# Patient Record
Sex: Female | Born: 1965 | Hispanic: Yes | Marital: Single | Smoking: Never smoker
Health system: Southern US, Community
[De-identification: ages and names within clinical notes are randomized; demographics above are authoritative.]

## PROBLEM LIST (undated history)

## (undated) DIAGNOSIS — Z5189 Encounter for other specified aftercare: Secondary | ICD-10-CM

## (undated) DIAGNOSIS — D649 Anemia, unspecified: Secondary | ICD-10-CM

## (undated) HISTORY — PX: CHOLECYSTECTOMY: SHX55

## (undated) HISTORY — DX: Morbid (severe) obesity due to excess calories: E66.01

## (undated) HISTORY — PX: APPENDECTOMY: SHX54

## (undated) HISTORY — DX: Anemia, unspecified: D64.9

---

## 1993-08-29 HISTORY — PX: TUBAL LIGATION: SHX77

## 2001-10-26 ENCOUNTER — Encounter: Payer: Self-pay | Admitting: Surgery

## 2001-10-26 ENCOUNTER — Observation Stay (HOSPITAL_COMMUNITY): Admission: EM | Admit: 2001-10-26 | Discharge: 2001-10-28 | Payer: Self-pay | Admitting: Emergency Medicine

## 2001-10-26 ENCOUNTER — Encounter: Payer: Self-pay | Admitting: Emergency Medicine

## 2002-01-29 ENCOUNTER — Emergency Department (HOSPITAL_COMMUNITY): Admission: EM | Admit: 2002-01-29 | Discharge: 2002-01-30 | Payer: Self-pay | Admitting: Emergency Medicine

## 2002-01-30 ENCOUNTER — Encounter: Payer: Self-pay | Admitting: Emergency Medicine

## 2002-02-28 ENCOUNTER — Emergency Department (HOSPITAL_COMMUNITY): Admission: EM | Admit: 2002-02-28 | Discharge: 2002-02-28 | Payer: Self-pay

## 2002-12-22 ENCOUNTER — Emergency Department (HOSPITAL_COMMUNITY): Admission: EM | Admit: 2002-12-22 | Discharge: 2002-12-22 | Payer: Self-pay | Admitting: Emergency Medicine

## 2003-06-07 ENCOUNTER — Emergency Department (HOSPITAL_COMMUNITY): Admission: EM | Admit: 2003-06-07 | Discharge: 2003-06-07 | Payer: Self-pay | Admitting: Emergency Medicine

## 2003-11-22 ENCOUNTER — Emergency Department (HOSPITAL_COMMUNITY): Admission: EM | Admit: 2003-11-22 | Discharge: 2003-11-22 | Payer: Self-pay | Admitting: Emergency Medicine

## 2004-08-26 ENCOUNTER — Emergency Department (HOSPITAL_COMMUNITY): Admission: EM | Admit: 2004-08-26 | Discharge: 2004-08-26 | Payer: Self-pay | Admitting: Emergency Medicine

## 2004-12-05 ENCOUNTER — Emergency Department (HOSPITAL_COMMUNITY): Admission: EM | Admit: 2004-12-05 | Discharge: 2004-12-05 | Payer: Self-pay | Admitting: Emergency Medicine

## 2005-04-04 ENCOUNTER — Emergency Department (HOSPITAL_COMMUNITY): Admission: EM | Admit: 2005-04-04 | Discharge: 2005-04-04 | Payer: Self-pay | Admitting: Emergency Medicine

## 2005-06-07 ENCOUNTER — Inpatient Hospital Stay (HOSPITAL_COMMUNITY): Admission: EM | Admit: 2005-06-07 | Discharge: 2005-06-09 | Payer: Self-pay | Admitting: Emergency Medicine

## 2005-06-07 ENCOUNTER — Encounter (INDEPENDENT_AMBULATORY_CARE_PROVIDER_SITE_OTHER): Payer: Self-pay | Admitting: *Deleted

## 2008-02-14 ENCOUNTER — Emergency Department (HOSPITAL_COMMUNITY): Admission: EM | Admit: 2008-02-14 | Discharge: 2008-02-14 | Payer: Self-pay | Admitting: Emergency Medicine

## 2009-03-15 ENCOUNTER — Emergency Department (HOSPITAL_COMMUNITY): Admission: EM | Admit: 2009-03-15 | Discharge: 2009-03-15 | Payer: Self-pay | Admitting: Emergency Medicine

## 2009-10-28 ENCOUNTER — Emergency Department (HOSPITAL_COMMUNITY): Admission: EM | Admit: 2009-10-28 | Discharge: 2009-10-29 | Payer: Self-pay | Admitting: Emergency Medicine

## 2010-09-18 ENCOUNTER — Encounter: Payer: Self-pay | Admitting: Gastroenterology

## 2010-11-21 LAB — URINALYSIS, ROUTINE W REFLEX MICROSCOPIC
Bilirubin Urine: NEGATIVE
Glucose, UA: NEGATIVE mg/dL
Ketones, ur: NEGATIVE mg/dL
Nitrite: NEGATIVE
Protein, ur: NEGATIVE mg/dL
Specific Gravity, Urine: 1.008 (ref 1.005–1.030)
Urobilinogen, UA: 0.2 mg/dL (ref 0.0–1.0)
pH: 6 (ref 5.0–8.0)

## 2010-11-21 LAB — URINE CULTURE: Colony Count: >=100000

## 2010-11-21 LAB — URINE MICROSCOPIC-ADD ON

## 2010-12-05 LAB — POCT URINALYSIS DIP (DEVICE)
Bilirubin Urine: NEGATIVE
Glucose, UA: NEGATIVE mg/dL
Ketones, ur: NEGATIVE mg/dL
Nitrite: NEGATIVE
Protein, ur: NEGATIVE mg/dL
Specific Gravity, Urine: <=1.005 (ref 1.005–1.030)
Urobilinogen, UA: 0.2 mg/dL (ref 0.0–1.0)
pH: 6 (ref 5.0–8.0)

## 2010-12-05 LAB — POCT PREGNANCY, URINE: Preg Test, Ur: NEGATIVE

## 2011-01-14 NOTE — Discharge Summary (Signed)
NAMESEHER, SCHLAGEL             ACCOUNT NO.:  [id number]   MEDICAL RECORD NO.:  [id number]          PATIENT TYPE:  INP   LOCATION:  5703                         FACILITY:  [facility]   PHYSICIAN:  Lebron Conners, M.D.   DATE OF BIRTH:  [birth year]   DATE OF ADMISSION:  06/07/2005  DATE OF DISCHARGE:  06/09/2005                                 DISCHARGE SUMMARY   DISCHARGE DIAGNOSES:  1.  Cholelithiasis, acute cholecystitis and choledocholithiasis, status post      laparoscopic cholecystectomy with intraoperative cholangiogram on      June 07, 2005 by Dr. Orson Slick.  2.  History of cesarean section.   HOSPITAL COURSE:  Ms. Vinzant is a 45 year old female who presented to the  emergency room with a 1 day history of epigastric right upper quadrant pain.  She was diagnosed with acute cholecystitis and underwent a laparoscopic  cholecystectomy with intraoperative cholangiogram on June 07, 2005. Dr.  Orson Slick performed the procedure and he was concerned that she had a retained  common bile duct stone as demonstrated on the cholangiogram. The following  day, the patient underwent an ERCP and sphincterotomy with stone removal by  Dr. Virginia Rochester. The patient remained in the hospital overnight and was felt to be  ready for discharge to home.   LABORATORY DATA:  Her lab studies during her hospital stay did show a mild  elevation of her liver function studies but by discharge, her AST was 144,  ALT 307, alkaline phosphatase 158, and total bilirubin 0.8.   DISPOSITION:  She is discharged to home in stable condition.   DISCHARGE MEDICATIONS:  1.  She was instructed to use Tylenol or Ibuprofen as needed.  2.  She may use Vicodin as prescribed.   FOLLOW UP:  She is to followup with Dr. Orson Slick on June 28, 2005 at 9:45  a.m.      Guy Franco, P.A.      Lebron Conners, M.D.  Electronically Signed    LB/MEDQ  D:  07/29/2005  T:  07/30/2005  Job:  [id number]   cc:   Georgiana Spinner, M.D.  Fax:  [phone]   Lebron Conners, M.D.  [address]  [place]  [place] [address]

## 2011-01-14 NOTE — Op Note (Signed)
[name], [name]             ACCOUNT NO.:  [id number]   MEDICAL RECORD NO.:  [id number]          PATIENT TYPE:  INP   LOCATION:  5703                         FACILITY:  [facility]   PHYSICIAN:  Georgiana Spinner, M.D.    DATE OF BIRTH:  [birth year]   DATE OF PROCEDURE:  DATE OF DISCHARGE:                                 OPERATIVE REPORT   PROCEDURE:  Endoscopic retrograde cholangiopancreatography with  sphincterotomy and stone removal.   ANESTHESIA:  Demerol 75 mg, Versed 8 mg.   DESCRIPTION OF PROCEDURE:  The patient was mildly sedated in the prone  position in room #9 of radiology at [facility].  The Olympus side-  viewing duodenal scope was inserted in the mouth and passed through the  esophagus into the stomach; and then under direct vision, we entered into  the duodenal bulb, shortened the endoscope, and pulled up to the ampulla of  Vater.  It appeared normal. Subsequently Triton catheter was advanced  through the endoscope and we cannulated the common bile duct on the first  pass.  A guidewire was passed proximally in the valve and we followed the  guidewire up with the catheter; and once we were past the bile duct we  started injecting contrast material and we pulled back on the catheter  allowing the guidewire to stay in place.  A small stone was seen distally.  Therefore we prepped the patient to do a sphincterotomy with pads.   Subsequently a sphincterotomy was made by me not extending as far as the  overlying first fold, approximately two-thirds or three-fourths of the way,  but I thought it was adequate for this situation.  Copious amounts of  sludge; and, I think, a stone also came out at that time.  We then withdrew  the Triton catheter keeping the guidewire in place.  Over the guidewire we  passed an 8-mm balloon, advanced it into the bile duct and proximally and  inflated it, and pulled through twice with brown sludge from the bile duct.  On the third pass we  called Dr. Lorin Picket, of radiology, to view as we withdrew  this over the __________ through the sphincterotomy opening.  We agreed that  this seemed to be adequate.  We had made an occlusion cholangiogram prior to  this pass and air entered into the bile duct to obscure any filling defect.  We felt that with we had gotten out the previously noted stone and sludge  material and if there were any residuals material it should be __________  sphincterotomy, so the guidewire was withdrawn with the balloon on the last  pass.  We terminated the procedure and through the endoscope we suction the  air as we went.  The patient's vital signs were stable and the patient  tolerated the procedure well.  __________ The remainder of the radiographic  substance unremarkable.  The clips were intact and no leakage was seen.           ______________________________  Georgiana Spinner, M.D.     GMO/MEDQ  D:  06/08/2005  T:  06/08/2005  Job:  [id number]   cc:   Lorre Munroe., M.D.  [address], [address]  [place]  [place] [address]

## 2011-01-14 NOTE — Op Note (Signed)
[name], [name]             ACCOUNT NO.:  [id number]   MEDICAL RECORD NO.:  [id number]          PATIENT TYPE:  INP   LOCATION:  5703                         FACILITY:  [facility]   PHYSICIAN:  Lorre Munroe., M.D.DATE OF BIRTH:  Dec 31, 1965   DATE OF PROCEDURE:  06/07/2005  DATE OF DISCHARGE:                                 OPERATIVE REPORT   PREOPERATIVE DIAGNOSIS:  Cholelithiasis, probable acute cholecystitis.   POSTOPERATIVE DIAGNOSIS:  Cholelithiasis and choledocholithiasis.   OPERATION:  Laparoscopic cholecystectomy.   SURGEON:  Lebron Conners, M.D.   ANESTHESIA:  General and local.   PROCEDURE:  After the patient was monitored and anesthetized and had routine  preparation and draping of the abdomen, I infiltrated local anesthetic just  below the umbilicus and utilized part of the previous lower midline incision  to cut the skin for about 2 cm and then cut the fascia for about 2 cm  longitudinally.  I easily was able to dissect into the abdomen bluntly.  There were some fatty adhesions around the area and I easily got those  pushed side so that I could get pursestring suture in.  I placed a 0 Vicryl  pursestring suture and secured a Hasson cannula and then breaking through a  couple of very small filmy areas of adhesion had a good view of the right  upper quadrant.  I anesthetized three additional sites and put in an 11 mm  epigastric port and two 5 mm right midabdominal ports.  The gallbladder was  seen to be quite distended and somewhat thick-walled.  I decompressed it  with a suction aspirator.  I then retracted the fundus of the gallbladder  toward the right shoulder and took down adhesions which were present to the  undersurface and then dissected until I could see the infundibulum and  retracted that laterally.  I then dissected the hepatoduodenal ligament and  posterior surface of the gallbladder until I was able to pull the  infundibulum quite far laterally and  saw the cystic duct emerging from the  infundibulum.  I dissected for while to be absolutely certain I was viewing  the cystic duct because it was quite large.  I saw the cystic artery and  clipped and divided it.  I clipped the cystic duct just as it emerged from  the infundibulum and then made a small hole in it.  I noted the bile to be  under a little pressure.  I put in a cholangiogram catheter and secured that  with a clip and performed a fluoroscopic cholangiogram demonstrating normal  anatomy with good radicles seen on the right and left side of the liver, a  good long cystic duct but a dilated common bile duct with what I felt were  at least two filling defects, one of which was seen be mostly obstructing  the duct distally.  I then resumed laparoscopy and withdrew the  cholangiogram catheter and tried to pass a catheter distally but could not  get down through the cystic duct as it was quite long and tortuous.  I did  not think I would  have been able to remove the stone anyway because it was  quite large.  I then clipped the cystic duct with three clips distally and  divided it.  I dissected the gallbladder out of its fossa utilizing the  cautery and gaining hemostasis with cautery.  After removing it from the  liver, placed it in a plastic pouch.  I then irrigated the right upper  quadrant and removed the irrigant and got good hemostasis in the gallbladder  fossa.  It appeared that the clips on the cystic duct and the cystic artery  were secure.  Sponge, needle and instrument counts were correct.  I removed  the gallbladder in its plastic pouch through the umbilical incision and  then tied the pursestring suture.  I then removed the remaining irrigant in  the right upper quadrant and removed the lateral ports under direct view,  then allowed the carbon dioxide to escape and removed the epigastric port.  I closed the skin incisions with intracuticular 4-0 Vicryl and Steri-Strips.   The patient was stable through the procedure.      Lorre Munroe., M.D.  Electronically Signed     WB/MEDQ  D:  06/07/2005  T:  06/07/2005  Job:  [id number]

## 2012-08-10 ENCOUNTER — Encounter (HOSPITAL_COMMUNITY): Payer: Self-pay | Admitting: Internal Medicine

## 2012-08-10 ENCOUNTER — Observation Stay (HOSPITAL_COMMUNITY): Payer: Self-pay

## 2012-08-10 ENCOUNTER — Inpatient Hospital Stay (HOSPITAL_COMMUNITY)
Admission: AD | Admit: 2012-08-10 | Discharge: 2012-08-12 | DRG: 812 | Disposition: A | Discharge status: Home / Self Care | Payer: MEDICAID | Source: Ambulatory Visit | Attending: Family Medicine | Admitting: Family Medicine

## 2012-08-10 DIAGNOSIS — N12 Tubulo-interstitial nephritis, not specified as acute or chronic: Secondary | ICD-10-CM

## 2012-08-10 DIAGNOSIS — R109 Unspecified abdominal pain: Secondary | ICD-10-CM

## 2012-08-10 DIAGNOSIS — R935 Abnormal findings on diagnostic imaging of other abdominal regions, including retroperitoneum: Secondary | ICD-10-CM | POA: Diagnosis present

## 2012-08-10 DIAGNOSIS — R1030 Lower abdominal pain, unspecified: Secondary | ICD-10-CM

## 2012-08-10 DIAGNOSIS — N921 Excessive and frequent menstruation with irregular cycle: Secondary | ICD-10-CM

## 2012-08-10 DIAGNOSIS — D649 Anemia, unspecified: Secondary | ICD-10-CM

## 2012-08-10 DIAGNOSIS — N920 Excessive and frequent menstruation with regular cycle: Secondary | ICD-10-CM | POA: Diagnosis present

## 2012-08-10 DIAGNOSIS — D509 Iron deficiency anemia, unspecified: Principal | ICD-10-CM

## 2012-08-10 LAB — CBC
HCT: 19.8 % — ABNORMAL LOW (ref 36.0–46.0)
MCHC: 27.3 g/dL — ABNORMAL LOW (ref 30.0–36.0)
Platelets: 441 10*3/uL — ABNORMAL HIGH (ref 150–400)
RDW: 19.4 % — ABNORMAL HIGH (ref 11.5–15.5)

## 2012-08-10 LAB — RETICULOCYTES
RBC.: 3.54 MIL/uL — ABNORMAL LOW (ref 3.87–5.11)
Retic Count, Absolute: 60.2 10*3/uL (ref 19.0–186.0)
Retic Ct Pct: 1.7 % (ref 0.4–3.1)

## 2012-08-10 LAB — COMPREHENSIVE METABOLIC PANEL
AST: 35 U/L (ref 0–37)
Albumin: 3.8 g/dL (ref 3.5–5.2)
Alkaline Phosphatase: 108 U/L (ref 39–117)
BUN: 10 mg/dL (ref 6–23)
Potassium: 3.9 mEq/L (ref 3.5–5.1)
Sodium: 136 mEq/L (ref 135–145)
Total Protein: 7.3 g/dL (ref 6.0–8.3)

## 2012-08-10 LAB — URINALYSIS, ROUTINE W REFLEX MICROSCOPIC
Bilirubin Urine: NEGATIVE
Ketones, ur: NEGATIVE mg/dL
Nitrite: NEGATIVE
Protein, ur: NEGATIVE mg/dL
Urobilinogen, UA: 0.2 mg/dL (ref 0.0–1.0)

## 2012-08-10 LAB — URINE MICROSCOPIC-ADD ON

## 2012-08-10 LAB — GLUCOSE, CAPILLARY: Glucose-Capillary: 96 mg/dL (ref 70–99)

## 2012-08-10 MED ORDER — ACETAMINOPHEN 650 MG RE SUPP: 650.0000 mg | Freq: Four times a day (QID) | RECTAL | Status: DC | PRN

## 2012-08-10 MED ORDER — SODIUM CHLORIDE 0.9 % IV SOLN: 250.0000 mL | INTRAVENOUS | Status: DC | PRN

## 2012-08-10 MED ORDER — SODIUM CHLORIDE 0.9 % IV BOLUS (SEPSIS)
1000.0000 mL | Freq: Once | INTRAVENOUS | Status: AC
  Administered 2012-08-10: 1000 mL via INTRAVENOUS

## 2012-08-10 MED ORDER — ACETAMINOPHEN 325 MG PO TABS
650.0000 mg | ORAL_TABLET | Freq: Four times a day (QID) | ORAL | Status: DC | PRN
  Administered 2012-08-11 – 2012-08-12 (×2): 650 mg via ORAL
  Filled 2012-08-10 (×2): qty 2

## 2012-08-10 MED ORDER — SODIUM CHLORIDE 0.9 % IJ SOLN
3.0000 mL | Freq: Two times a day (BID) | INTRAMUSCULAR | Status: DC
  Administered 2012-08-11 – 2012-08-12 (×3): 3 mL via INTRAVENOUS

## 2012-08-10 MED ORDER — ONDANSETRON HCL 4 MG PO TABS: 4.0000 mg | ORAL_TABLET | Freq: Four times a day (QID) | ORAL | Status: DC | PRN

## 2012-08-10 MED ORDER — IOHEXOL 300 MG/ML  SOLN
100.0000 mL | Freq: Once | INTRAMUSCULAR | Status: AC | PRN
  Administered 2012-08-10: 100 mL via INTRAVENOUS

## 2012-08-10 MED ORDER — ONDANSETRON HCL 4 MG/2ML IJ SOLN
4.0000 mg | Freq: Four times a day (QID) | INTRAMUSCULAR | Status: DC | PRN
  Filled 2012-08-10: qty 2

## 2012-08-10 MED ORDER — SODIUM CHLORIDE 0.9 % IJ SOLN: 3.0000 mL | INTRAMUSCULAR | Status: DC | PRN

## 2012-08-10 MED ORDER — POLYETHYLENE GLYCOL 3350 17 G PO PACK
17.0000 g | PACK | Freq: Every day | ORAL | Status: DC | PRN
  Filled 2012-08-10: qty 1

## 2012-08-10 NOTE — Progress Notes (Signed)
Patient received on 5 east at this time as a direct admission.  V/S obtained and blood sugar obtained.  IV placed in right forearm.  Page placed to Dr. Blake Divine.  Dr. Robb Matar came up to bedside.  Patient is awake alert and oriented as per patient's son.  Blood pressure was 84/55.  Dr. Robb Matar is aware.  Patient instructed to lay down in the bed and to use call bell to call for assistance.  Patient verbalizes understanding.  Will continue to monitor,  Blood sugar upon admission to the floor was 96.  No physician orders at this time.

## 2012-08-10 NOTE — Progress Notes (Signed)
Received a call from PA Ms Cherylann Ratel regarding Ms [name] who needs to be admitted for evaluation of severe anemia with ahemoglobin of 5 and also complaining abdominal pain. As per the PA, she is hemodynamically stable.   Accepted the patient to telemetry for prbc transfusion and evaluation of anemia and abdominal pain.     Kathlen Mody [phone]

## 2012-08-10 NOTE — H&P (Addendum)
[facility] History and Physical  [name] ZOX:[id number] DOB: May 05, 1966 DOA: 08/10/2012  Referring physician: none PCP: No primary provider on file.  Specialists: none  Chief Complaint: weakness  HPI: [name] is a 46 y.o. female  With no significant past medical history the step by her primary care Dr. to the hospital because of a low hemoglobin. She was seen by her primary care doctor because of weakness. She relates this weakness has resulted in worse over the past 3 months, she's been having irregular and heavy periods for the last 2 months. Had a CBC that showed a low hemoglobin. She's also been complaining of some mild lower tunnel pain started about 3 months ago unchanged nothing makes it better or worse. She relates some mild temperatures at home she took Advil for has not taking anything else. She does not take Advil regularly. She relates no melanotic stools or bright red blood per rectum. She relates no hematemesis or bloody urine. No vaginal discharge no Dysuria  Review of Systems: The patient denies anorexia, fever, weight loss,, vision loss, decreased hearing, hoarseness, chest pain, syncope, dyspnea on exertion, peripheral edema, balance deficits, hemoptysis, abdominal pain, melena, hematochezia, severe indigestion/heartburn, hematuria, incontinence, genital sores, muscle weakness, suspicious skin lesions, transient blindness, difficulty walking, depression, unusual weight change, abnormal bleeding, enlarged lymph nodes, angioedema, and breast masses.    History reviewed. No pertinent past medical history. Past Surgical History  Procedure Date  . Cholecystectomy    Social History:  does not have a smoking history on file. She does not have any smokeless tobacco history on file. She reports that she does not drink alcohol or use illicit drugs. Home perform all her ADLs  Not on File  Family History  Problem Relation Age of Onset  . Other Mother    . Other Father     Prior to Admission medications   Not on File   Physical Exam: Filed Vitals:   08/10/12 1903  BP: 84/55  Pulse: 78  Temp: 98.2 F (36.8 C)  TempSrc: Oral  Resp: 18  SpO2: 100%     General:  Awake alert and oriented x3 no acute distress  Eyes: Anicteric  ENT: Moist extremity, black geographic tongue.  Neck: No JVD  Cardiovascular: Regular rate and rhythm with positive S1 and S2  Respiratory: Good air movement clear to auscultation  Abdomen: Positive bowel sounds bilateral tenderness in the lower: There's no rebound or guarding  Skin: No rashes ulcerations  Musculoskeletal: Intact  Psychiatric: Appropriate  Neurologic: Nonfocal  Labs on Admission:  Basic Metabolic Panel: No results found for this basename: NA:5,K:5,CL:5,CO2:5,GLUCOSE:5,BUN:5,CREATININE:5,CALCIUM:5,MG:5,PHOS:5 in the last 168 hours Liver Function Tests: No results found for this basename: AST:5,ALT:5,ALKPHOS:5,BILITOT:5,PROT:5,ALBUMIN:5 in the last 168 hours No results found for this basename: LIPASE:5,AMYLASE:5 in the last 168 hours No results found for this basename: AMMONIA:5 in the last 168 hours CBC: No results found for this basename: WBC:5,NEUTROABS:5,HGB:5,HCT:5,MCV:5,PLT:5 in the last 168 hours Cardiac Enzymes: No results found for this basename: CKTOTAL:5,CKMB:5,CKMBINDEX:5,TROPONINI:5 in the last 168 hours  BNP (last 3 results) No results found for this basename: PROBNP:3 in the last 8760 hours CBG: No results found for this basename: GLUCAP:5 in the last 168 hours  Radiological Exams on Admission: No results found.  EKG: None  Assessment/Plan Weakness secondary to Anemia: - She didn't denies any melanotic stools or bright blood per rectum, she denies taking NSAIDs except for yesterday morning where she felt a little bit of a fever. She's been  having heavy and irregular periods for the past 3 months which could be contributed to her anemia. I will go ahead  and get an anemia panel, will check a CBC and transfuse 2 units of packed red blood cells to try to bring her up above 7. And check a CBC posttransfusion. - She denies any hematemesis any bloody urine. - Check LFTs. - Check a cortisol level.  Lower abdominal pain - Suprapubic tenderness and bilateral lower quadrants and abdominal pain with palpation. We'll get a UA, will check a TSH, but a CT scan of the abdomen and pelvis with and without contrast to rule out endometriosis and fibroids. She relates she's been having heavy and irregular periods for the last 3 months. - Also check LFTs to see there is any kind of the hepatic disorder contributing to this lower abdominal pain. As she is 46 years old, fertile female in mildly obese. Also check a lipase, she denies any vaginal discharge.  - check a cortisol, she does have abdominal pain, low Blood pressure and black tongue. ? Addison disease.  Code Status: Full code Family Communication: none Disposition Plan: observation  Time spent: 60 minutes  Marinda Elk [facility] Pager [phone]  If 7PM-7AM, please contact night-coverage [email or web address] Password [facility] 08/10/2012, 7:22 PM

## 2012-08-11 ENCOUNTER — Encounter (HOSPITAL_COMMUNITY): Payer: Self-pay | Admitting: *Deleted

## 2012-08-11 ENCOUNTER — Observation Stay (HOSPITAL_COMMUNITY): Payer: Self-pay

## 2012-08-11 DIAGNOSIS — D509 Iron deficiency anemia, unspecified: Secondary | ICD-10-CM

## 2012-08-11 DIAGNOSIS — N12 Tubulo-interstitial nephritis, not specified as acute or chronic: Secondary | ICD-10-CM

## 2012-08-11 DIAGNOSIS — N920 Excessive and frequent menstruation with regular cycle: Secondary | ICD-10-CM

## 2012-08-11 DIAGNOSIS — N921 Excessive and frequent menstruation with irregular cycle: Secondary | ICD-10-CM

## 2012-08-11 HISTORY — DX: Iron deficiency anemia, unspecified: D50.9

## 2012-08-11 HISTORY — DX: Tubulo-interstitial nephritis, not specified as acute or chronic: N12

## 2012-08-11 LAB — IRON AND TIBC
Saturation Ratios: 2 % — ABNORMAL LOW (ref 20–55)
UIBC: 552 ug/dL — ABNORMAL HIGH (ref 125–400)

## 2012-08-11 LAB — CBC
HCT: 28.9 % — ABNORMAL LOW (ref 36.0–46.0)
Hemoglobin: 8.9 g/dL — ABNORMAL LOW (ref 12.0–15.0)
MCH: 19.8 pg — ABNORMAL LOW (ref 26.0–34.0)
MCHC: 30.8 g/dL (ref 30.0–36.0)
RDW: 29.1 % — ABNORMAL HIGH (ref 11.5–15.5)

## 2012-08-11 LAB — FERRITIN: Ferritin: <1 ng/mL — ABNORMAL LOW (ref 10–291)

## 2012-08-11 LAB — PROTIME-INR: INR: 1.21 (ref 0.00–1.49)

## 2012-08-11 LAB — HEMOGLOBIN A1C
Hgb A1c MFr Bld: 5.9 % — ABNORMAL HIGH (ref <5.7)
Mean Plasma Glucose: 123 mg/dL — ABNORMAL HIGH (ref <117)

## 2012-08-11 LAB — HIV ANTIBODY (ROUTINE TESTING W REFLEX): HIV: NONREACTIVE

## 2012-08-11 LAB — FOLATE: Folate: 17.9 ng/mL

## 2012-08-11 LAB — CORTISOL: Cortisol, Plasma: 9.3 ug/dL

## 2012-08-11 MED ORDER — INFLUENZA VIRUS VACC SPLIT PF IM SUSP
0.5000 mL | Freq: Once | INTRAMUSCULAR | Status: AC
  Administered 2012-08-11: 0.5 mL via INTRAMUSCULAR
  Filled 2012-08-11: qty 0.5

## 2012-08-11 MED ORDER — FERROUS SULFATE 325 (65 FE) MG PO TABS
325.0000 mg | ORAL_TABLET | Freq: Three times a day (TID) | ORAL | Status: DC
  Administered 2012-08-11 – 2012-08-12 (×3): 325 mg via ORAL
  Filled 2012-08-11 (×5): qty 1

## 2012-08-11 MED ORDER — DEXTROSE 5 % IV SOLN
1.0000 g | Freq: Every day | INTRAVENOUS | Status: DC
  Administered 2012-08-11 – 2012-08-12 (×2): 1 g via INTRAVENOUS
  Filled 2012-08-11 (×2): qty 10

## 2012-08-11 MED ORDER — DOCUSATE SODIUM 100 MG PO CAPS
100.0000 mg | ORAL_CAPSULE | Freq: Two times a day (BID) | ORAL | Status: DC
  Administered 2012-08-11 – 2012-08-12 (×2): 100 mg via ORAL
  Filled 2012-08-11 (×3): qty 1

## 2012-08-11 NOTE — Progress Notes (Signed)
[facility] PROGRESS NOTE  [name] ZOX:[id number] DOB: September 23, 1965 DOA: 08/10/2012 PCP: Avel Sensor, FNP  Assessment/Plan: 1. Profound microcytic/iron-deficiency anemia--secondary to menometrorrhagia. S/p 2 U PRBC. Follow CBC. Will need close outpatient follow-up with PCP and GYN follow-up. 2. Menometrorrhagia--as above. 3. Abdominal pain--resolved. CT ab/pelvis unrevealing 4. Left flank pain--possible pyelonephritis. Empiric abx. 5. Abnormal CT, US--history somewhat suggestive of infection. U/A unimpressive. Culture pending. Outpatient follow-up.  History/interview and questions performed with assistance of telephone interpreter.  Code Status: full code Family Communication: none present Disposition Plan: home 12/15 if Hgb stable.  Brendia Sacks, MD  [facility] Team 6 Pager [phone] If 8PM-8AM, please contact night-coverage at [email or web address], password [facility] 08/11/2012, 4:32 PM  LOS: 1 day   Brief narrative: 46 year old woman no PMH presented from PCP office for anemia. History of heavy menses. Also complained of abdominal pain.  Consultants:  none  Procedures:  none  HPI/Subjective: Feels fine, no complaints. Reports heavy menses for 2 months, also history of anemia in past for which she was on iron. No rectal bleeding. No abdominal pain now, but some left flank pain. Denies dysuria. Currently menstruating.  Objective: Filed Vitals:   08/11/12 0625 08/11/12 0715 08/11/12 0800 08/11/12 1429  BP: 95/61 89/62 88/61  99/60  Pulse: 64 58 56 64  Temp: 98.6 F (37 C) 98.3 F (36.8 C) 98.2 F (36.8 C) 97.5 F (36.4 C)  TempSrc: Oral Oral Oral Oral  Resp: 18 18 18 18   Height:      Weight:      SpO2: 98%   97%    Intake/Output Summary (Last 24 hours) at 08/11/12 1632 Last data filed at 08/11/12 0936  Gross per 24 hour  Intake   1130 ml  Output    450 ml  Net    680 ml   Filed Weights   08/10/12 2010 08/11/12 0500  Weight: 75.2 kg (165  lb 12.6 oz) 78 kg (171 lb 15.3 oz)    Exam:  General:  Appears calm and comfortable Cardiovascular: RRR, no m/r/g. No LE edema. Respiratory: CTA bilaterally, no w/r/r. Normal respiratory effort. Abdomen: soft, ntnd; mild left flank pain Psychiatric: grossly normal mood and affect  Data Reviewed: Basic Metabolic Panel:  Lab 08/10/12 8295  NA 136  K 3.9  CL 103  CO2 25  GLUCOSE 81  BUN 10  CREATININE 0.60  CALCIUM 9.1  MG --  PHOS --   Liver Function Tests:  Lab 08/10/12 2024  AST 35  ALT 37*  ALKPHOS 108  BILITOT 0.3  PROT 7.3  ALBUMIN 3.8    Lab 08/10/12 2024  LIPASE 24  AMYLASE --   CBC:  Lab 08/11/12 0952 08/10/12 2024  WBC 8.9 9.3  NEUTROABS -- --  HGB 8.9* 5.4*  HCT 28.9* 19.8*  MCV 64.2* 55.0*  PLT 334 441*   CBG:  Lab 08/10/12 1909  GLUCAP 96    Studies: Ct Abdomen Pelvis W Contrast  08/10/2012  *RADIOLOGY REPORT*  Clinical Data:  Lower abdominal pain, anemia, weakness  CT ABDOMEN AND PELVIS WITH CONTRAST  Technique:  Multidetector CT imaging of the abdomen and pelvis was performed following the standard protocol during bolus administration of intravenous contrast.  Sagittal and coronal MPR images reconstructed from axial data set.  Contrast: OMNIPAQUE IOHEXOL 300 MG/ML  SOLN Dilute oral contrast.  Comparison: Prior exam predates PACs and is unavailable for comparison.  Findings: Lung bases clear. Patchy appearance of the nephrograms bilaterally, nonspecific. This can be  seen with pyelonephritis and with infiltrative processes. Gallbladder surgically absent. Liver, spleen, pancreas, and adrenal glands normal appearance. Normal appendix. Unremarkable bladder, uterus, and adnexae. Stomach and bowel loops normal appearance. No mass, adenopathy, free fluid, or inflammatory process. No acute osseous findings.  IMPRESSION: Patchy bilateral nephrograms, question pyelonephritis, though infiltrative processes are not entirely excluded. Correlation with  urinalysis recommended. No other definite intra abdominal or intrapelvic abnormalities identified.   Original Report Authenticated By: Ulyses Southward, M.D.    US Renal  08/11/2012  *RADIOLOGY REPORT*  Clinical Data: Possible pyelonephritis on CT  RENAL/URINARY TRACT ULTRASOUND COMPLETE  Comparison:  CT abdomen pelvis dated 08/10/2012  Findings:  Right Kidney:  Measures 12.5 cm.  Mildly heterogeneous parenchymal echogenicity.  No mass or hydronephrosis.  Left Kidney:  Measures 11.9 cm.  Mildly heterogeneous parenchymal echogenicity.  No mass or hydronephrosis.  Bladder:  Within normal limits.  IMPRESSION: Mildly heterogeneous renal parenchymal echogenicity.  While nonspecific, this appearance statistically likely reflects pyelonephritis when correlating with CT.  In the absence of signs/symptoms of UTI, infiltrative processes would remain possible.  No hydronephrosis.   Original Report Authenticated By: Charline Bills, M.D.     Scheduled Meds:   . cefTRIAXone (ROCEPHIN)  IV  1 g Intravenous Daily  . sodium chloride  3 mL Intravenous Q12H   Continuous Infusions:   Principal Problem:  *Microcytic anemia Active Problems:  Lower abdominal pain  Anemia  Menometrorrhagia  Pyelonephritis     Brendia Sacks, MD  [facility] Team 6 Pager [phone] If 8PM-8AM, please contact night-coverage at [email or web address], password [facility] 08/11/2012, 4:32 PM  LOS: 1 day   Time spent: 35 minutes

## 2012-08-11 NOTE — Progress Notes (Signed)
CRITICAL VALUE ALERT  Critical value received:  Hgb 5.4  Date of notification:  08/10/12  Time of notification:  2152  Critical value read back:yes  Nurse who received alert:  S.Young,RN  MD notified (1st page): MD aware Time of first page:    MD notified (2nd page):  Time of second page:  Responding MD:    Time MD responded:

## 2012-08-12 LAB — TYPE AND SCREEN
Antibody Screen: NEGATIVE
Unit division: 0
Unit division: 0

## 2012-08-12 LAB — HIV-1 RNA QUANT-NO REFLEX-BLD: HIV-1 RNA Quant, Log: <1.3 {Log} (ref <1.30)

## 2012-08-12 LAB — CBC
HCT: 30.9 % — ABNORMAL LOW (ref 36.0–46.0)
MCH: 19.1 pg — ABNORMAL LOW (ref 26.0–34.0)
MCV: 63.6 fL — ABNORMAL LOW (ref 78.0–100.0)
Platelets: 320 10*3/uL (ref 150–400)
RDW: 29.4 % — ABNORMAL HIGH (ref 11.5–15.5)
WBC: 8 10*3/uL (ref 4.0–10.5)

## 2012-08-12 LAB — URINE CULTURE: Colony Count: NO GROWTH

## 2012-08-12 MED ORDER — DSS 100 MG PO CAPS: 100.0000 mg | ORAL_CAPSULE | Freq: Two times a day (BID) | ORAL | Status: DC

## 2012-08-12 MED ORDER — ACETAMINOPHEN 325 MG PO TABS: 650.0000 mg | ORAL_TABLET | Freq: Four times a day (QID) | ORAL | Status: DC | PRN

## 2012-08-12 MED ORDER — FERROUS SULFATE 325 (65 FE) MG PO TABS: 325.0000 mg | ORAL_TABLET | Freq: Three times a day (TID) | ORAL | Status: DC

## 2012-08-12 NOTE — Progress Notes (Signed)
[facility] PROGRESS NOTE  [name] AOZ:[id number] DOB: 1965-11-06 DOA: 08/10/2012 PCP: Avel Sensor, FNP  Assessment/Plan: 1. Profound microcytic/iron-deficiency anemia--secondary to menometrorrhagia. Stable s/p 2 U PRBC. Not currently menstruating.Stressed to patient close outpatient follow-up with PCP and GYN. 2. Menometrorrhagia--as above. 3. Abdominal pain--resolved. CT ab/pelvis unrevealing except for possible pyelonephritis 4. Left flank pain--possible pyelonephritis. Resume Cipro on discharge. 5. Abnormal CT, US--history somewhat suggestive of infection. U/A unimpressive but patient already on Cipro and Flagyl x2 days prior to admission via PCP for abdominal pain. Follow-up culture.  History/interview and questions performed with assistance of telephone interpreter today 12/15. Stressed follow-up next week, iron supplementation, finish Cipro, stop Flagyl. All questions answered. Patient reports PCP stopped Amaryl. Glucose and HgbA1c unremarkable. Follow-up as outpatient.  Code Status: full code Family Communication: none present Disposition Plan: home   Brendia Sacks, MD  [facility] Team 6 Pager [phone] If 8PM-8AM, please contact night-coverage at [email or web address], password [facility] 08/12/2012, 12:14 PM  LOS: 2 days   Brief narrative: 46 year old woman no PMH presented from PCP office for anemia. History of heavy menses. Also complained of abdominal pain.  Consultants:  none  Procedures:  none  HPI/Subjective: Feels better. No abdominal pain. Not menstruating. Ready to go home.  Objective: Filed Vitals:   08/11/12 0800 08/11/12 1429 08/11/12 2200 08/12/12 0600  BP: 88/61 99/60 107/67 105/63  Pulse: 56 64 72 62  Temp: 98.2 F (36.8 C) 97.5 F (36.4 C) 98.3 F (36.8 C) 98.4 F (36.9 C)  TempSrc: Oral Oral Oral Oral  Resp: 18 18 18 18   Height:      Weight:    76.204 kg (168 lb)  SpO2:  97% 94% 95%    Intake/Output Summary (Last 24  hours) at 08/12/12 1214 Last data filed at 08/12/12 1017  Gross per 24 hour  Intake    603 ml  Output      0 ml  Net    603 ml   Filed Weights   08/10/12 2010 08/11/12 0500 08/12/12 0600  Weight: 75.2 kg (165 lb 12.6 oz) 78 kg (171 lb 15.3 oz) 76.204 kg (168 lb)    Exam:  General:  Appears calm and comfortable Cardiovascular: RRR, no m/r/g.  Respiratory: CTA bilaterally, no w/r/r. Normal respiratory effort. Psychiatric: grossly normal mood and affect  Data Reviewed: Basic Metabolic Panel:  Lab 08/10/12 4132  NA 136  K 3.9  CL 103  CO2 25  GLUCOSE 81  BUN 10  CREATININE 0.60  CALCIUM 9.1  MG --  PHOS --   Liver Function Tests:  Lab 08/10/12 2024  AST 35  ALT 37*  ALKPHOS 108  BILITOT 0.3  PROT 7.3  ALBUMIN 3.8    Lab 08/10/12 2024  LIPASE 24  AMYLASE --   CBC:  Lab 08/12/12 0610 08/11/12 0952 08/10/12 2024  WBC 8.0 8.9 9.3  NEUTROABS -- -- --  HGB 9.3* 8.9* 5.4*  HCT 30.9* 28.9* 19.8*  MCV 63.6* 64.2* 55.0*  PLT 320 334 441*   CBG:  Lab 08/10/12 1909  GLUCAP 96    Studies: Ct Abdomen Pelvis W Contrast  08/10/2012  *RADIOLOGY REPORT*  Clinical Data:  Lower abdominal pain, anemia, weakness  CT ABDOMEN AND PELVIS WITH CONTRAST  Technique:  Multidetector CT imaging of the abdomen and pelvis was performed following the standard protocol during bolus administration of intravenous contrast.  Sagittal and coronal MPR images reconstructed from axial data set.  Contrast: OMNIPAQUE IOHEXOL 300 MG/ML  SOLN Dilute oral contrast.  Comparison: Prior exam predates PACs and is unavailable for comparison.  Findings: Lung bases clear. Patchy appearance of the nephrograms bilaterally, nonspecific. This can be seen with pyelonephritis and with infiltrative processes. Gallbladder surgically absent. Liver, spleen, pancreas, and adrenal glands normal appearance. Normal appendix. Unremarkable bladder, uterus, and adnexae. Stomach and bowel loops normal appearance. No  mass, adenopathy, free fluid, or inflammatory process. No acute osseous findings.  IMPRESSION: Patchy bilateral nephrograms, question pyelonephritis, though infiltrative processes are not entirely excluded. Correlation with urinalysis recommended. No other definite intra abdominal or intrapelvic abnormalities identified.   Original Report Authenticated By: Ulyses Southward, M.D.    US Renal  08/11/2012  *RADIOLOGY REPORT*  Clinical Data: Possible pyelonephritis on CT  RENAL/URINARY TRACT ULTRASOUND COMPLETE  Comparison:  CT abdomen pelvis dated 08/10/2012  Findings:  Right Kidney:  Measures 12.5 cm.  Mildly heterogeneous parenchymal echogenicity.  No mass or hydronephrosis.  Left Kidney:  Measures 11.9 cm.  Mildly heterogeneous parenchymal echogenicity.  No mass or hydronephrosis.  Bladder:  Within normal limits.  IMPRESSION: Mildly heterogeneous renal parenchymal echogenicity.  While nonspecific, this appearance statistically likely reflects pyelonephritis when correlating with CT.  In the absence of signs/symptoms of UTI, infiltrative processes would remain possible.  No hydronephrosis.   Original Report Authenticated By: Charline Bills, M.D.     Scheduled Meds:    . cefTRIAXone (ROCEPHIN)  IV  1 g Intravenous Daily  . docusate sodium  100 mg Oral BID  . ferrous sulfate  325 mg Oral TID WC  . sodium chloride  3 mL Intravenous Q12H   Continuous Infusions:   Principal Problem:  *Microcytic anemia Active Problems:  Lower abdominal pain  Anemia  Menometrorrhagia  Pyelonephritis     Brendia Sacks, MD  [facility] Team 6 Pager [phone] If 8PM-8AM, please contact night-coverage at [email or web address], password [facility] 08/12/2012, 12:14 PM  LOS: 2 days

## 2012-08-12 NOTE — Care Management Note (Signed)
    Page 1 of 1   08/12/2012     1:56:30 PM   CARE MANAGEMENT NOTE 08/12/2012  Patient:  [name], [name]   Account Number:  [id number]  Date Initiated:  08/12/2012  Documentation initiated by:  Lanier Clam  Subjective/Objective Assessment:   ADMITTED W/ANEMIA.     Action/Plan:   SPANISH SPEAKING.FROM HOME W/FAMILY.   Anticipated DC Date:  08/12/2012   Anticipated DC Plan:  HOME/SELF CARE  In-house referral  Interpreting Services      DC Planning Services  CM consult      Choice offered to / List presented to:             Status of service:  Completed, signed off Medicare Important Message given?   (If response is "NO", the following Medicare IM given date fields will be blank) Date Medicare IM given:   Date Additional Medicare IM given:    Discharge Disposition:  HOME/SELF CARE  Per UR Regulation:    If discussed at Long Length of Stay Meetings, dates discussed:    Comments:  08/12/12 Carlissa Pesola RN,BSN NCM [phone] RECEIVED CM CONS FOR INFO ON HEALTH INSURANCE.USED INTERPRETING TELEPHONE SERVICES FOR SPANISH.PROVIDED W/WEBSITE FOR HEALTH INSURANCE MARKET RESOURCE WEBSITE HEALTHCARE.GOV, DEPT OF SOCIAL SERVICES TEL#,ADDRESS.SHE SAYS SHE CAN AFFORD [facility] $4 MEDS,PROVIDED HER W/[facility] $4MED LIST, DISCOUNT CARD,OTHER COMMUNITY RESOURCES.

## 2012-08-12 NOTE — Discharge Summary (Addendum)
Physician Discharge Summary  [name]:[id number] DOB: Oct 04, 1965 DOA: 08/10/2012  PCP: Avel Sensor, FNP  Admit date: 08/10/2012 Discharge date: 08/12/2012  Recommendations for Outpatient Follow-up:  1. Follow-up microcytic anemia, menometrorrhagia. 2. Follow-up urine culture--no growth, final  3. Follow-up abnormal appearance of kidneys, consider repeat imaging to ensure resolution after treatment for possible pyelonephritis.   Follow-up Information    Follow up with Puglisi, Pamala Duffel, FNP. In 1 week.   Contact information:   [address] [place] [address] [phone]         Discharge Diagnoses:  1. Profound microcytic/iron-deficiency anemia 2. Menometrorrhagia- 3. Abdominal pain 4. Abnormal CT abdomen/pelvis, [place]  Discharge Condition: improved Disposition: home  Diet recommendation: regular  Filed Weights   08/10/12 2010 08/11/12 0500 08/12/12 0600  Weight: 75.2 kg (165 lb 12.6 oz) 78 kg (171 lb 15.3 oz) 76.204 kg (168 lb)    History of present illness:  46 year old woman no PMH presented from PCP office for anemia. History of heavy menses. Also complained of abdominal pain.  Hospital Course:  Ms. Quilter was admitted for profound anemia which responded appropriately to transfusion. Menses ceased while in hospital and hemoglobin remains stable. Presumed etiology is menometrorrhagia. Because of abdominal pain, CT abdomen/pelvis was obtained which revealed abnormal appearance of kidneys confirmed with ultrasound, favored to represent pyelonephritis though an infiltrative process could not be excluded. Urinalysis was unremarkable and culture is pending, however, patient was treated with antibiotics prior to admission which will limit utility of inpatient studies. At this point no clinical history or signs of infiltrative process. Patient did have abdominal and flank pain. Suggest outpatient follow-up as above.  1. Profound  microcytic/iron-deficiency anemia--secondary to menometrorrhagia. Stable s/p 2 U PRBC. Not currently menstruating.Stressed to patient close outpatient follow-up with PCP and GYN.  2. Menometrorrhagia--as above.  3. Abdominal pain--resolved. CT ab/pelvis unrevealing except for possible pyelonephritis  4. Left flank pain--possible pyelonephritis. Resume Cipro on discharge.  5. Abnormal CT, US--history somewhat suggestive of infection. U/A unimpressive but patient already on Cipro and Flagyl x2 days prior to admission via PCP for abdominal pain. Follow-up culture.  History/interview and questions performed with assistance of telephone interpreter today 12/15. Stressed follow-up next week, iron supplementation, finish Cipro, stop Flagyl. All questions answered. Patient reports PCP stopped Amaryl. Glucose and HgbA1c unremarkable. Follow-up as outpatient.  Consultants:  none  Procedures:  2 units PRBC  Discharge Instructions  Discharge Orders    Future Orders Please Complete By Expires   Diet general      Discharge instructions      Comments:   Be sure to finish antibiotic (Cipro). Take iron for anemia. Note that iron will cause stool to turn black and can cause constipation. Take stool softener if needed. Call physician or seek immediate medical attention for recurrent pain, heavy bleeding or worsening of condition.   Activity as tolerated - No restrictions          Medication List     As of 08/12/2012 12:23 PM    STOP taking these medications         glimepiride 1 MG tablet   Commonly known as: AMARYL      ibuprofen 400 MG tablet   Commonly known as: ADVIL,MOTRIN      metroNIDAZOLE 500 MG tablet   Commonly known as: FLAGYL      TAKE these medications         acetaminophen 325 MG tablet   Commonly known as: TYLENOL  Take 2 tablets (650 mg total) by mouth every 6 (six) hours as needed for pain.      ciprofloxacin 500 MG tablet   Commonly known as: CIPRO   Take 500 mg by  mouth 2 (two) times daily.      DSS 100 MG Caps   Take 100 mg by mouth 2 (two) times daily.      ferrous sulfate 325 (65 FE) MG tablet   Take 1 tablet (325 mg total) by mouth 3 (three) times daily with meals.       The results of significant diagnostics from this hospitalization (including imaging, microbiology, ancillary and laboratory) are listed below for reference.    Significant Diagnostic Studies: Ct Abdomen Pelvis W Contrast  08/10/2012  *RADIOLOGY REPORT*  Clinical Data:  Lower abdominal pain, anemia, weakness  CT ABDOMEN AND PELVIS WITH CONTRAST  Technique:  Multidetector CT imaging of the abdomen and pelvis was performed following the standard protocol during bolus administration of intravenous contrast.  Sagittal and coronal MPR images reconstructed from axial data set.  Contrast: OMNIPAQUE IOHEXOL 300 MG/ML  SOLN Dilute oral contrast.  Comparison: Prior exam predates PACs and is unavailable for comparison.  Findings: Lung bases clear. Patchy appearance of the nephrograms bilaterally, nonspecific. This can be seen with pyelonephritis and with infiltrative processes. Gallbladder surgically absent. Liver, spleen, pancreas, and adrenal glands normal appearance. Normal appendix. Unremarkable bladder, uterus, and adnexae. Stomach and bowel loops normal appearance. No mass, adenopathy, free fluid, or inflammatory process. No acute osseous findings.  IMPRESSION: Patchy bilateral nephrograms, question pyelonephritis, though infiltrative processes are not entirely excluded. Correlation with urinalysis recommended. No other definite intra abdominal or intrapelvic abnormalities identified.   Original Report Authenticated By: Ulyses Southward, M.D.    US Renal  08/11/2012  *RADIOLOGY REPORT*  Clinical Data: Possible pyelonephritis on CT  RENAL/URINARY TRACT ULTRASOUND COMPLETE  Comparison:  CT abdomen pelvis dated 08/10/2012  Findings:  Right Kidney:  Measures 12.5 cm.  Mildly heterogeneous  parenchymal echogenicity.  No mass or hydronephrosis.  Left Kidney:  Measures 11.9 cm.  Mildly heterogeneous parenchymal echogenicity.  No mass or hydronephrosis.  Bladder:  Within normal limits.  IMPRESSION: Mildly heterogeneous renal parenchymal echogenicity.  While nonspecific, this appearance statistically likely reflects pyelonephritis when correlating with CT.  In the absence of signs/symptoms of UTI, infiltrative processes would remain possible.  No hydronephrosis.   Original Report Authenticated By: Charline Bills, M.D.     Microbiology: Recent Results (from the past 240 hour(s))  CULTURE, BLOOD (ROUTINE X 2)     Status: Normal (Preliminary result)   Collection Time   08/10/12  8:15 PM      Component Value Range Status Comment   Specimen Description BLOOD RIGHT ARM   Final    Special Requests BOTTLES DRAWN AEROBIC AND ANAEROBIC 10CC   Final    Culture  Setup Time 08/11/2012 02:49   Final    Culture     Final    Value:        BLOOD CULTURE RECEIVED NO GROWTH TO DATE CULTURE WILL BE HELD FOR 5 DAYS BEFORE ISSUING A FINAL NEGATIVE REPORT   Report Status PENDING   Incomplete   CULTURE, BLOOD (ROUTINE X 2)     Status: Normal (Preliminary result)   Collection Time   08/10/12  8:25 PM      Component Value Range Status Comment   Specimen Description BLOOD RIGHT HAND   Final    Special Requests BOTTLES DRAWN AEROBIC  AND ANAEROBIC 10CC   Final    Culture  Setup Time 08/11/2012 02:49   Final    Culture     Final    Value:        BLOOD CULTURE RECEIVED NO GROWTH TO DATE CULTURE WILL BE HELD FOR 5 DAYS BEFORE ISSUING A FINAL NEGATIVE REPORT   Report Status PENDING   Incomplete   URINE CULTURE     Status: Normal   Collection Time   08/11/12  9:28 AM      Component Value Range Status Comment   Specimen Description URINE, CLEAN CATCH   Final    Special Requests NONE   Final    Culture  Setup Time 08/11/2012 14:10   Final    Colony Count NO GROWTH   Final    Culture NO GROWTH   Final     Report Status 08/12/2012 FINAL   Final      Labs: Basic Metabolic Panel:  Lab 08/10/12 1610  NA 136  K 3.9  CL 103  CO2 25  GLUCOSE 81  BUN 10  CREATININE 0.60  CALCIUM 9.1  MG --  PHOS --   Liver Function Tests:  Lab 08/10/12 2024  AST 35  ALT 37*  ALKPHOS 108  BILITOT 0.3  PROT 7.3  ALBUMIN 3.8    Lab 08/10/12 2024  LIPASE 24  AMYLASE --   CBC:  Lab 08/12/12 0610 08/11/12 0952 08/10/12 2024  WBC 8.0 8.9 9.3  NEUTROABS -- -- --  HGB 9.3* 8.9* 5.4*  HCT 30.9* 28.9* 19.8*  MCV 63.6* 64.2* 55.0*  PLT 320 334 441*   CBG:  Lab 08/10/12 1909  GLUCAP 96    Principal Problem:  *Microcytic anemia Active Problems:  Lower abdominal pain  Anemia  Menometrorrhagia  Pyelonephritis   Time coordinating discharge: 25 minutes  Signed:  Brendia Sacks, MD [facility] 08/12/2012, 12:23 PM

## 2012-08-12 NOTE — Progress Notes (Signed)
Patient given discharge instructions, discussed with RN with telephone Spanish interpreter.  Patient verbalized understanding using teach back method including necessary medications (ferrous sulfate, colace, and tylenol).  Patient had no further questions of discharge instructions.  See AVS for complete discharge instructions.  Patient had all belongings at bedside.  Vitals stable, denies pain.  Patient discharged to home via ride from son.  Unable to complete My Chart sign in due to no valid social security number.  IV removed, site WNL, catheter intact.  No other questions or concerns at this time.  Patient stable gate, ambulatory.  Discharged via wheel chair.  Barrie Lyme 3:33 PM 08/12/2012

## 2012-08-17 LAB — CULTURE, BLOOD (ROUTINE X 2): Culture: NO GROWTH

## 2012-08-29 ENCOUNTER — Encounter (HOSPITAL_COMMUNITY): Payer: Self-pay | Admitting: *Deleted

## 2012-08-29 ENCOUNTER — Emergency Department (HOSPITAL_COMMUNITY)
Admission: EM | Admit: 2012-08-29 | Discharge: 2012-08-29 | Disposition: A | Discharge status: Home / Self Care | Payer: Self-pay | Attending: Emergency Medicine | Admitting: Emergency Medicine

## 2012-08-29 DIAGNOSIS — Z79899 Other long term (current) drug therapy: Secondary | ICD-10-CM | POA: Insufficient documentation

## 2012-08-29 DIAGNOSIS — D649 Anemia, unspecified: Secondary | ICD-10-CM

## 2012-08-29 DIAGNOSIS — Z5189 Encounter for other specified aftercare: Secondary | ICD-10-CM

## 2012-08-29 DIAGNOSIS — Z3202 Encounter for pregnancy test, result negative: Secondary | ICD-10-CM | POA: Insufficient documentation

## 2012-08-29 DIAGNOSIS — N946 Dysmenorrhea, unspecified: Secondary | ICD-10-CM | POA: Insufficient documentation

## 2012-08-29 HISTORY — DX: Anemia, unspecified: D64.9

## 2012-08-29 HISTORY — DX: Encounter for other specified aftercare: Z51.89

## 2012-08-29 LAB — CBC WITH DIFFERENTIAL/PLATELET
Eosinophils Relative: 7 % — ABNORMAL HIGH (ref 0–5)
HCT: 34 % — ABNORMAL LOW (ref 36.0–46.0)
Lymphs Abs: 2.7 10*3/uL (ref 0.7–4.0)
MCV: 69.4 fL — ABNORMAL LOW (ref 78.0–100.0)
Monocytes Relative: 8 % (ref 3–12)
Neutro Abs: 3.3 10*3/uL (ref 1.7–7.7)
RBC: 4.9 MIL/uL (ref 3.87–5.11)
WBC: 7.2 10*3/uL (ref 4.0–10.5)

## 2012-08-29 LAB — BASIC METABOLIC PANEL
BUN: 12 mg/dL (ref 6–23)
CO2: 23 mEq/L (ref 19–32)
Chloride: 102 mEq/L (ref 96–112)
Creatinine, Ser: 0.42 mg/dL — ABNORMAL LOW (ref 0.50–1.10)
Glucose, Bld: 137 mg/dL — ABNORMAL HIGH (ref 70–99)

## 2012-08-29 MED ORDER — MEGESTROL ACETATE 40 MG PO TABS: 40.0000 mg | ORAL_TABLET | Freq: Every day | ORAL | Status: DC

## 2012-08-29 NOTE — ED Notes (Signed)
Per [employer], pt to ED for eval of heavy menses. States she can't get into to see her pmd. This is 3rd time in 2 weeks. Pt recently admitted to [facility] and given blood transfusion due to anemia. Requesting pill to stop her period. Last normal menses was 2 months ago.

## 2012-08-29 NOTE — ED Provider Notes (Signed)
History     CSN: [id number]  Arrival date & time 08/29/12  1659   First MD Initiated Contact with Patient 08/29/12 1855      Chief Complaint  Patient presents with  . Abdominal Pain    (Consider location/radiation/quality/duration/timing/severity/associated sxs/prior treatment) HPI  The patient presents to the emergency department with complaints of vaginal bleeding that is irregular. The patient was admitted to the hospital a couple of weeks ago for the same time and a hemoglobin of 5 requiring blood transfusions. During the patient's stay in the emergency department she did not get a medication to help with bleeding. Therefore she continues to bleed. Today she says she feels a little dizzy and has been bleeding fairly heavily for the past 2 days. She comes requesting a "pill" that we'll stop the bleeding. The patient is spanish speaking and the interview was done by myself. She is not having any pain, no loc, no N/V/D or weakness. No Cp, SOB, or bleeding from anywhere else. nad vss  Past Medical History  Diagnosis Date  . Blood transfusion without reported diagnosis     Past Surgical History  Procedure Date  . Cholecystectomy     Family History  Problem Relation Age of Onset  . Other Mother   . Other Father     History  Substance Use Topics  . Smoking status: Never Smoker   . Smokeless tobacco: Never Used  . Alcohol Use: No    OB History    Grav Para Term Preterm Abortions TAB SAB Ect Mult Living                  Review of Systems  Review of Systems  Gen: no weight loss, fevers, chills, night sweats  Eyes: no discharge or drainage, no occular pain or visual changes  Nose: no epistaxis or rhinorrhea  Mouth: no dental pain, no sore throat  Neck: no neck pain  Lungs:No wheezing, coughing or hemoptysis CV: no chest pain, palpitations, dependent edema or orthopnea  Abd: no abdominal pain, nausea, vomiting  GU: no dysuria or gross hematuria, + vaginal  bleeding MSK:  No abnormalities  Neuro: no headache, no focal neurologic deficits  Skin: no abnormalities Psyche: negative.   Allergies  Review of patient's allergies indicates no known allergies.  Home Medications   Current Outpatient Rx  Name  Route  Sig  Dispense  Refill  . ACETAMINOPHEN 325 MG PO TABS   Oral   Take 650 mg by mouth every 6 (six) hours as needed. For pain/fever         . FERROUS SULFATE 325 (65 FE) MG PO TABS   Oral   Take 325 mg by mouth 3 (three) times daily with meals.         . MEGESTROL ACETATE 40 MG PO TABS   Oral   Take 1 tablet (40 mg total) by mouth daily.   45 tablet   0     Take 3 tabs all at one time for first 5 days, then ...     BP 105/62  Pulse 67  Temp 98.2 F (36.8 C) (Oral)  Resp 20  SpO2 98%  LMP 08/03/2012  Physical Exam  Nursing note and vitals reviewed. Constitutional: She appears well-developed and well-nourished. No distress.  HENT:  Head: Normocephalic and atraumatic.  Eyes: Pupils are equal, round, and reactive to light.  Neck: Normal range of motion. Neck supple.  Cardiovascular: Normal rate and regular rhythm.   Pulmonary/Chest:  Effort normal.  Abdominal: Soft.  Neurological: She is alert.  Skin: Skin is warm and dry.    ED Course  Procedures (including critical care time)  Labs Reviewed  CBC WITH DIFFERENTIAL - Abnormal; Notable for the following:    Hemoglobin 10.6 (*)     HCT 34.0 (*)     MCV 69.4 (*)     MCH 21.6 (*)     Eosinophils Relative 7 (*)     All other components within normal limits  BASIC METABOLIC PANEL - Abnormal; Notable for the following:    Glucose, Bld 137 (*)     Creatinine, Ser 0.42 (*)     All other components within normal limits  POCT PREGNANCY, URINE   No results found.   1. Dysmenorrhea       MDM  I discussed case with on-call gynecologist.  The patient does not need ultrasound on an emergent basis.  Negative pregnancy test.  He has asked me to write her  Megestrol 3 tabs x 5 days, then 2 tabs x 5 days then 1 tab q day until Gynecology says other wise. I discussed plan and results with patient using interpretor phones. She voices her understanding and has agreed to follow-up with the referred Gyn.  Pt has been advised of the symptoms that warrant their return to the ED. Patient has voiced understanding and has agreed to follow-up with the PCP or specialist.        Dorthula Matas, PA 08/29/12 2133

## 2012-08-29 NOTE — ED Provider Notes (Signed)
Medical screening examination/treatment/procedure(s) were performed by non-physician practitioner and as supervising physician I was immediately available for consultation/collaboration.   Kadeem Hyle, MD 08/29/12 2334 

## 2012-09-03 ENCOUNTER — Telehealth: Payer: Self-pay | Admitting: *Deleted

## 2012-09-03 NOTE — Telephone Encounter (Signed)
Female caller called and left a message 08/31/12 stating he is calling for [name] who was seen at [facility] ER and was told she needs to schedule a Ultrasound appointment- requests a call. Per chart review patient has an appointment in our gyn clinic for follow up on 09/17/12

## 2012-09-04 NOTE — Telephone Encounter (Signed)
Call returned to [place] (pt's son). I explained that we do not have information stating that his mother needs an [place]. When she is seen in our clinic on 1/20, the doctor will review the records and make recommendations for plan of care. He agreed and voiced understanding.

## 2012-09-17 ENCOUNTER — Ambulatory Visit (INDEPENDENT_AMBULATORY_CARE_PROVIDER_SITE_OTHER): Payer: Self-pay | Admitting: Obstetrics & Gynecology

## 2012-09-17 ENCOUNTER — Encounter: Payer: Self-pay | Admitting: Obstetrics & Gynecology

## 2012-09-17 VITALS — BP 115/74 | HR 79 | Temp 97.8°F | Wt 163.3 lb

## 2012-09-17 DIAGNOSIS — N938 Other specified abnormal uterine and vaginal bleeding: Secondary | ICD-10-CM

## 2012-09-17 DIAGNOSIS — N920 Excessive and frequent menstruation with regular cycle: Secondary | ICD-10-CM

## 2012-09-17 DIAGNOSIS — Z0000 Encounter for general adult medical examination without abnormal findings: Secondary | ICD-10-CM

## 2012-09-17 DIAGNOSIS — Z01419 Encounter for gynecological examination (general) (routine) without abnormal findings: Secondary | ICD-10-CM

## 2012-09-17 DIAGNOSIS — N949 Unspecified condition associated with female genital organs and menstrual cycle: Secondary | ICD-10-CM

## 2012-09-17 NOTE — Progress Notes (Signed)
  Subjective:    Patient ID: [name], female    DOB: [birth year], 47 y.o.   MRN: [id number]  HPI  47 yo M H P3 (all cesareans) who is here after a visit to [employer]. She was diagnosed with menorrhagia and given megace daily which has relieved her bleeding. She says her periods last 7 days and are extremely heavy (hbg 10). They were lighter until about a year ago.  Review of Systems   She and her husband live in the same house but they are "separated" and are not sexually active. She says that she had a flu vaccine in [place] last month. Objective:   Physical Exam  Uterus- 10 week size, generally tender Normal adnexal exam      Assessment & Plan:  Menorrhagia and anemia- schedule pelvic u/s, TSH Preventative care- pap smear done today, schedule mammogram RTC 1 month for probable endometrial biopsy

## 2012-10-18 ENCOUNTER — Encounter: Payer: Self-pay | Admitting: Medical

## 2012-10-18 ENCOUNTER — Ambulatory Visit (INDEPENDENT_AMBULATORY_CARE_PROVIDER_SITE_OTHER): Payer: Self-pay | Admitting: Medical

## 2012-10-18 VITALS — BP 92/57 | HR 82 | Temp 97.8°F | Ht <= 58 in | Wt 164.8 lb

## 2012-10-18 DIAGNOSIS — N920 Excessive and frequent menstruation with regular cycle: Secondary | ICD-10-CM

## 2012-10-18 MED ORDER — FERROUS SULFATE 325 (65 FE) MG PO TBEC: 325.0000 mg | DELAYED_RELEASE_TABLET | Freq: Every day | ORAL | Status: DC

## 2012-10-18 NOTE — Progress Notes (Signed)
Patient ID: [name], female   DOB: November 19, 1965, 47 y.o.   MRN: [id number]  History:  Ms. [name]  is a 47 y.o. [phone] who presents to clinic today for follow-up for DUB. The patient was seen by Dr. Marice Potter in January. She was supposed to get an [place] between then and now and that was not scheduled. The patient was given Megace and finished the medication > 2 weeks ago. She has had one episode of vaginal bleeding that lasted 3 days and was not as heavy as her previous episodes of bleeding. She is taking PO iron.    The following portions of the patient's history were reviewed and updated as appropriate: allergies, current medications, past family history, past medical history, past social history, past surgical history and problem list.  Review of Systems:  Pertinent items are noted in HPI.  Objective:  Physical Exam BP 92/57  Pulse 82  Temp(Src) 97.8 F (36.6 C) (Oral)  Ht 4\' 9"  (1.448 m)  Wt 164 lb 12.8 oz (74.753 kg)  BMI 35.65 kg/m2 GENERAL: Well-developed, well-nourished female in no acute distress.  HEENT: Normocephalic, atraumatic.   LUNGS: Normal rate.  HEART: Regular rate.  SKIN: Warm, dry without rashes  Labs and Imaging [place] scheduled today  Assessment & Plan:  Assessment: DUB, ? Resolved with medication management  Plans: U/S scheduled for 10/22/12 Patient will follow-up in 2 weeks after U/S for results Possible Endometrial biopsy at that time depending on U/S results and bleeding between now and next appointment Rx for Ferrous Sulfate sent to patient's pharmacy. Will recheck CBC at next visit and possibly d/c iron at that time.

## 2012-10-18 NOTE — Patient Instructions (Signed)
Menorragia (Menorrhagia) La menorragia refiere a un perodo menstrual ms profuso o prolongado de lo normal. CUIDADOS EN EL HOGAR  Slo tome los medicamentos que le haya indicado el profesional.  No tome aspirina una semana antes o durante su perodo. La aspirina puede hacer que el sangrado empeore.  Haga reposo en cama por un tiempo si debe cambiar el tampn o la toalla femenina ms de una vez cada 2 horas. Esto puede ayudar a Warehouse manager.  Tome pldoras de hierro segn le haya indicado el mdico. El sangrado abundante puede provocar falta de hierro en su cuerpo.  Consuma una dieta saludable y alimentos con hierro. Estos alimentos incluyen vegetales de [employer], carne, hgado, huevos y panes y Medical laboratory scientific officer de grano entero.  Consuma alimentos con [employer] de vitamina C, como [place], jugo de naranja y pomelo. La vitamina C puede ayudar a su cuerpo a absorber ms hierro.  No intente bajar de peso. Espere hasta que no tenga ms sangrado abundante y sus niveles de hierro sean normales. SOLICITE AYUDA DE INMEDIATO SI:  Le sube la fiebre.  Tiene dificultades respiratorias.  Sangra an ms profusamente que lo habitual y elimina cogulos de [place].  Se siente mareada, dbil o pierde el conocimiento (se desmaya).  Debe cambiar la toalla femenina o el tampn ms de una vez cada hora.  Tiene malestar estomacal (nuseas), vmitos, o heces lquidas (diarrea).  Tiene problemas debido a los medicamentos. ASEGRESE DE QUE:   Comprende estas instrucciones.  Controlar su enfermedad.  Solicitar ayuda de inmediato si no mejora o empeora. Document Released: 09/17/2010 Document Revised: 11/07/2011 [facility] Patient Information 2013 [place], [place].

## 2012-10-22 ENCOUNTER — Ambulatory Visit (HOSPITAL_COMMUNITY): Payer: Self-pay | Attending: Obstetrics & Gynecology

## 2012-10-27 ENCOUNTER — Encounter (HOSPITAL_COMMUNITY): Payer: Self-pay | Admitting: *Deleted

## 2012-10-27 ENCOUNTER — Emergency Department (HOSPITAL_COMMUNITY): Payer: Self-pay

## 2012-10-27 ENCOUNTER — Emergency Department (HOSPITAL_COMMUNITY)
Admission: EM | Admit: 2012-10-27 | Discharge: 2012-10-27 | Disposition: A | Discharge status: Home / Self Care | Payer: Self-pay | Attending: Emergency Medicine | Admitting: Emergency Medicine

## 2012-10-27 DIAGNOSIS — N949 Unspecified condition associated with female genital organs and menstrual cycle: Secondary | ICD-10-CM | POA: Insufficient documentation

## 2012-10-27 DIAGNOSIS — R109 Unspecified abdominal pain: Secondary | ICD-10-CM | POA: Insufficient documentation

## 2012-10-27 DIAGNOSIS — D259 Leiomyoma of uterus, unspecified: Secondary | ICD-10-CM | POA: Insufficient documentation

## 2012-10-27 DIAGNOSIS — N898 Other specified noninflammatory disorders of vagina: Secondary | ICD-10-CM | POA: Insufficient documentation

## 2012-10-27 DIAGNOSIS — Z3202 Encounter for pregnancy test, result negative: Secondary | ICD-10-CM | POA: Insufficient documentation

## 2012-10-27 DIAGNOSIS — D649 Anemia, unspecified: Secondary | ICD-10-CM | POA: Insufficient documentation

## 2012-10-27 LAB — POCT I-STAT, CHEM 8
Calcium, Ion: 1.22 mmol/L (ref 1.12–1.23)
Glucose, Bld: 129 mg/dL — ABNORMAL HIGH (ref 70–99)
HCT: 27 % — ABNORMAL LOW (ref 36.0–46.0)
Hemoglobin: 9.2 g/dL — ABNORMAL LOW (ref 12.0–15.0)

## 2012-10-27 LAB — WET PREP, GENITAL
Clue Cells Wet Prep HPF POC: NONE SEEN
Trich, Wet Prep: NONE SEEN

## 2012-10-27 MED ORDER — IBUPROFEN 600 MG PO TABS: 600.0000 mg | ORAL_TABLET | Freq: Four times a day (QID) | ORAL | Status: DC | PRN

## 2012-10-27 MED ORDER — MEGESTROL ACETATE 40 MG PO TABS: 40.0000 mg | ORAL_TABLET | Freq: Every day | ORAL | Status: DC

## 2012-10-27 NOTE — ED Notes (Signed)
Reports heavy vaginal bleeding and needs refills for her ibuprofen and megace prescriptions. Was suppose to get US done on 2/24.

## 2012-10-27 NOTE — ED Provider Notes (Signed)
Medical screening examination/treatment/procedure(s) were performed by non-physician practitioner and as supervising physician I was immediately available for consultation/collaboration.    Jameire Kouba D Delaine Canter, MD 10/27/12 1558 

## 2012-10-27 NOTE — ED Provider Notes (Signed)
History     CSN: [id number]  Arrival date & time 10/27/12  7829   First MD Initiated Contact with Patient 10/27/12 [phone]      Chief Complaint  Patient presents with  . Medication Refill  . Vaginal Bleeding    (Consider location/radiation/quality/duration/timing/severity/associated sxs/prior treatment) HPI Comments: Patient presents with complaint of heavy vaginal bleeding for the past one week. She has a history of heavy vaginal bleeding. Patient states that she had an ultrasound scheduled on 10/23/11 but missed the appointment. She has this appointment rescheduled for next week. She's here today requesting refills of ibuprofen and Megace. She states that she has had lower abdominal pain bilaterally. She denies nausea, vomiting, diarrhea, or urinary symptoms. No fever. No treatments prior to arrival. Onset of symptoms gradual. Course is constant. Nothing makes symptoms better or worse.  Patient is a 47 y.o. female presenting with vaginal bleeding. The history is provided by the patient.  Vaginal Bleeding Associated symptoms include abdominal pain. Pertinent negatives include no chest pain, coughing, fever, headaches, myalgias, nausea, rash, sore throat or vomiting.    Past Medical History  Diagnosis Date  . Blood transfusion without reported diagnosis   . Anemia     Past Surgical History  Procedure Laterality Date  . Cholecystectomy    . Cesarean section      Family History  Problem Relation Age of Onset  . Other Mother   . Other Father     History  Substance Use Topics  . Smoking status: Never Smoker   . Smokeless tobacco: Never Used  . Alcohol Use: No    OB History   Grav Para Term Preterm Abortions TAB SAB Ect Mult Living   4 3 3  1  1   3       Review of Systems  Constitutional: Negative for fever.  HENT: Negative for sore throat and rhinorrhea.   Eyes: Negative for redness.  Respiratory: Negative for cough.   Cardiovascular: Negative for chest pain.   Gastrointestinal: Positive for abdominal pain. Negative for nausea, vomiting and diarrhea.  Genitourinary: Positive for vaginal bleeding and pelvic pain. Negative for dysuria.  Musculoskeletal: Negative for myalgias.  Skin: Negative for rash.  Neurological: Negative for syncope, light-headedness and headaches.    Allergies  Review of patient's allergies indicates no known allergies.  Home Medications   Current Outpatient Rx  Name  Route  Sig  Dispense  Refill  . acetaminophen (TYLENOL) 325 MG tablet   Oral   Take 650 mg by mouth every 6 (six) hours as needed. For pain/fever         . ferrous sulfate 325 (65 FE) MG EC tablet   Oral   Take 1 tablet (325 mg total) by mouth daily with breakfast.   30 tablet   3   . megestrol (MEGACE) 40 MG tablet   Oral   Take 1 tablet (40 mg total) by mouth daily.   45 tablet   0     Take 3 tabs all at one time for first 5 days, then ...   . Multiple Vitamin (MULTIVITAMIN) tablet   Oral   Take 1 tablet by mouth daily.           BP 110/72  Pulse 99  Temp(Src) 98.1 F (36.7 C) (Oral)  Resp 16  SpO2 100%  LMP 10/20/2012  Physical Exam  Nursing note and vitals reviewed. Constitutional: She appears well-developed and well-nourished.  HENT:  Head: Normocephalic and atraumatic.  Eyes:  Conjunctivae are normal. Right eye exhibits no discharge. Left eye exhibits no discharge.  Neck: Normal range of motion. Neck supple.  Cardiovascular: Normal rate, regular rhythm and normal heart sounds.   Pulmonary/Chest: Effort normal and breath sounds normal.  Abdominal: Soft. There is tenderness in the right lower quadrant and left lower quadrant. There is no rigidity, no rebound, no guarding, no tenderness at McBurney's point and negative Murphy's sign.  Genitourinary: Uterus is tender. Cervix exhibits discharge (blood). Cervix exhibits no motion tenderness and no friability. Right adnexum displays tenderness. Right adnexum displays no mass and  no fullness. Left adnexum displays tenderness. Left adnexum displays no mass and no fullness. There is bleeding around the vagina. No erythema or tenderness around the vagina. No vaginal discharge found.  Neurological: She is alert.  Skin: Skin is warm and dry.  Psychiatric: She has a normal mood and affect.    ED Course  Procedures (including critical care time)  Labs Reviewed  POCT I-STAT, CHEM 8 - Abnormal; Notable for the following:    Potassium 3.2 (*)    BUN 5 (*)    Creatinine, Ser 0.30 (*)    Glucose, Bld 129 (*)    Hemoglobin 9.2 (*)    HCT 27.0 (*)    All other components within normal limits  WET PREP, GENITAL  GC/CHLAMYDIA PROBE AMP  POCT PREGNANCY, URINE   US Transvaginal Non-ob  10/27/2012  *RADIOLOGY REPORT*  Clinical Data: Vaginal bleeding, pelvic pain  TRANSABDOMINAL AND TRANSVAGINAL ULTRASOUND OF PELVIS Technique:  Both transabdominal and transvaginal ultrasound examinations of the pelvis were performed. Transabdominal technique was performed for global imaging of the pelvis including uterus, ovaries, adnexal regions, and pelvic cul-de-sac.  It was necessary to proceed with endovaginal exam following the transabdominal exam to visualize the endometrium.  Comparison:  None  Findings:  Uterus: Measures 9.2 x 5.4 x 6.2 cm.  3.1 x 2.2 x 2.8 cm submucosal fibroid in the of the lower uterine segment.  1.5 x 1.2 x 1.4 cm submucosal fibroid in the left uterine fundus.  Endometrium: Measures 11 mm in thickness.  Right ovary:  Not visualized transabdominally/transvaginally.  Left ovary: Not visualized transabdominally/transvaginally.  Other findings: No free fluid.  IMPRESSION: Two submucosal uterine fibroids measuring up to 3.1 cm.  Endometrium measures 11 mm in thickness.  Bilateral ovaries are not discretely visualized transabdominally/transvaginally.   Original Report Authenticated By: Charline Bills, M.D.    US Pelvis Complete  10/27/2012  *RADIOLOGY REPORT*  Clinical Data:  Vaginal bleeding, pelvic pain  TRANSABDOMINAL AND TRANSVAGINAL ULTRASOUND OF PELVIS Technique:  Both transabdominal and transvaginal ultrasound examinations of the pelvis were performed. Transabdominal technique was performed for global imaging of the pelvis including uterus, ovaries, adnexal regions, and pelvic cul-de-sac.  It was necessary to proceed with endovaginal exam following the transabdominal exam to visualize the endometrium.  Comparison:  None  Findings:  Uterus: Measures 9.2 x 5.4 x 6.2 cm.  3.1 x 2.2 x 2.8 cm submucosal fibroid in the of the lower uterine segment.  1.5 x 1.2 x 1.4 cm submucosal fibroid in the left uterine fundus.  Endometrium: Measures 11 mm in thickness.  Right ovary:  Not visualized transabdominally/transvaginally.  Left ovary: Not visualized transabdominally/transvaginally.  Other findings: No free fluid.  IMPRESSION: Two submucosal uterine fibroids measuring up to 3.1 cm.  Endometrium measures 11 mm in thickness.  Bilateral ovaries are not discretely visualized transabdominally/transvaginally.   Original Report Authenticated By: Charline Bills, M.D.      1.  Vaginal bleeding   2. Fibroids, submucosal   3. Anemia     9:31 AM Patient seen and examined. Work-up initiated.   Vital signs reviewed and are as follows: Filed Vitals:   10/27/12 0901  BP: 110/72  Pulse: 99  Temp: 98.1 F (36.7 C)  Resp: 16   10:31 AM Pelvic performed with Johnston Ebbs RN as chaperone.   Ultrasound was performed. Findings reviewed by myself. Patient has fibroids identified. Patient informed of results via an interpreter phone. Patient prescribed Megace and ibuprofen. She's encouraged to continue iron supplementation. Urged to return with worsening bleeding, pain, persistent vomiting, or any other concerns. She is urged to followup as scheduled with her OB/GYN appointment.  Patient verbalizes understanding and agrees with plan.    MDM  Nonpregnant patient with heavy vaginal bleeding.  Asymptomatic anemia. No indication for blood transfusion. Patient missed previous appointments so ultrasound was performed today. It showed uterine fibroids without other abnormality. Patient received Megace at last visit. Will repeat this for bleeding control in the interim. Patient has scheduled appointment in 3 days with GYN. Patient appears well, ambulatory.        Renne Crigler, [place] 10/27/12 1356

## 2012-10-29 LAB — GC/CHLAMYDIA PROBE AMP
CT Probe RNA: NEGATIVE
GC Probe RNA: NEGATIVE

## 2012-10-30 ENCOUNTER — Ambulatory Visit (HOSPITAL_COMMUNITY): Admission: RE | Admit: 2012-10-30 | Payer: Self-pay | Source: Ambulatory Visit

## 2012-10-30 ENCOUNTER — Encounter: Payer: Self-pay | Admitting: Obstetrics & Gynecology

## 2012-10-30 ENCOUNTER — Other Ambulatory Visit: Payer: Self-pay | Admitting: Obstetrics & Gynecology

## 2012-11-01 ENCOUNTER — Encounter: Payer: Self-pay | Admitting: Medical

## 2012-11-01 ENCOUNTER — Ambulatory Visit (INDEPENDENT_AMBULATORY_CARE_PROVIDER_SITE_OTHER): Payer: Self-pay | Admitting: Medical

## 2012-11-01 ENCOUNTER — Other Ambulatory Visit: Payer: Self-pay | Admitting: Medical

## 2012-11-01 ENCOUNTER — Other Ambulatory Visit (HOSPITAL_COMMUNITY)
Admission: RE | Admit: 2012-11-01 | Discharge: 2012-11-01 | Disposition: A | Discharge status: Home / Self Care | Payer: Self-pay | Source: Ambulatory Visit | Attending: Medical | Admitting: Medical

## 2012-11-01 VITALS — BP 129/83 | HR 77 | Temp 97.6°F | Ht <= 58 in | Wt 166.9 lb

## 2012-11-01 DIAGNOSIS — Z01812 Encounter for preprocedural laboratory examination: Secondary | ICD-10-CM

## 2012-11-01 DIAGNOSIS — N8501 Benign endometrial hyperplasia: Secondary | ICD-10-CM | POA: Insufficient documentation

## 2012-11-01 DIAGNOSIS — N938 Other specified abnormal uterine and vaginal bleeding: Secondary | ICD-10-CM

## 2012-11-01 DIAGNOSIS — N949 Unspecified condition associated with female genital organs and menstrual cycle: Secondary | ICD-10-CM | POA: Insufficient documentation

## 2012-11-01 LAB — POCT PREGNANCY, URINE: Preg Test, Ur: NEGATIVE

## 2012-11-01 NOTE — Patient Instructions (Addendum)
Endometrial Biopsy This is a test in which a tissue sample (a biopsy) is taken from inside the uterus (womb). It is then looked at by a specialist under a microscope to see if the tissue is normal or abnormal. The endometrium is the lining of the uterus. This test helps determine where you are in your menstrual cycle and how hormone levels are affecting the lining of the uterus. Another use for this test is to diagnose endometrial cancer, tuberculosis, polyps, or inflammatory conditions and to evaluate uterine bleeding. PREPARATION FOR TEST No preparation or fasting is necessary. NORMAL FINDINGS No pathologic conditions. Presence of "secretory-type" endometrium 3 to 5 days before to normal menstruation. Ranges for normal findings may vary among different laboratories and hospitals. You should always check with your doctor after having lab work or other tests done to discuss the meaning of your test results and whether your values are considered within normal limits. MEANING OF TEST  Your caregiver will go over the test results with you and discuss the importance and meaning of your results, as well as treatment options and the need for additional tests if necessary. OBTAINING THE TEST RESULTS It is your responsibility to obtain your test results. Ask the lab or department performing the test when and how you will get your results. Document Released: 12/16/2004 Document Revised: 11/07/2011 Document Reviewed: 07/25/2008 [employer] Patient Information 2013 [employer].  

## 2012-11-01 NOTE — Progress Notes (Signed)
Patient ID: [name], female   DOB: Sep 01, 1965, 47 y.o.   MRN: [id number]  History:  [name]  is a 47 y.o. [id number] who presents to clinic today for follow-up and Endometrial biopsy. The patient has a history of DUB. US showed 2 submucosal fibroids measuring up to 3.1 cm and endometrium thickness of 11 mm. The patient was seen in the ED on 10/27/12 for heavy bleeding x 1 week. She states that she is only bleeding a little bit today. She continues to take iron and megace regularly.    The following portions of the patient's history were reviewed and updated as appropriate: allergies, current medications, past family history, past medical history, past social history, past surgical history and problem list.  Review of Systems:  Pertinent items are noted in HPI.  Objective:  Physical Exam BP 129/83  Pulse 77  Temp(Src) 97.6 F (36.4 C) (Oral)  Ht 4\' 9"  (1.448 m)  Wt 166 lb 14.4 oz (75.705 kg)  BMI 36.11 kg/m2  LMP 10/20/2012 GENERAL: Well-developed, well-nourished female in no acute distress.  HEENT: Normocephalic, atraumatic.   LUNGS: Normal rate. Clear to auscultation bilaterally.  HEART: Regular rate and rhythm with no adventitious sounds.  ABDOMEN: Soft, mild diffuse tenderness to palpation, nondistended. No organomegaly. Normal bowel sounds appreciated in all quadrants.  PELVIC: Normal external female genitalia. Vagina is pink and rugated.  Normal discharge. Normal cervix contour. EXTREMITIES: No cyanosis, clubbing, or edema.   Labs and Imaging US Pelvis Complete  10/27/2012  *RADIOLOGY REPORT*  Clinical Data: Vaginal bleeding, pelvic pain  TRANSABDOMINAL AND TRANSVAGINAL ULTRASOUND OF PELVIS Technique:  Both transabdominal and transvaginal ultrasound examinations of the pelvis were performed. Transabdominal technique was performed for global imaging of the pelvis including uterus, ovaries, adnexal regions, and pelvic cul-de-sac.  It was necessary to proceed with  endovaginal exam following the transabdominal exam to visualize the endometrium.  Comparison:  None  Findings:  Uterus: Measures 9.2 x 5.4 x 6.2 cm.  3.1 x 2.2 x 2.8 cm submucosal fibroid in the of the lower uterine segment.  1.5 x 1.2 x 1.4 cm submucosal fibroid in the left uterine fundus.  Endometrium: Measures 11 mm in thickness.  Right ovary:  Not visualized transabdominally/transvaginally.  Left ovary: Not visualized transabdominally/transvaginally.  Other findings: No free fluid.  IMPRESSION: Two submucosal uterine fibroids measuring up to 3.1 cm.  Endometrium measures 11 mm in thickness.  Bilateral ovaries are not discretely visualized transabdominally/transvaginally.   Original Report Authenticated By: Charline Bills, M.D.    ENDOMETRIAL BIOPSY     The indications for endometrial biopsy were reviewed.   Risks of the biopsy including cramping, bleeding, infection, uterine perforation, inadequate specimen and need for additional procedures  were discussed. The patient states she understands and agrees to undergo procedure today. Consent was signed. Time out was performed. Urine HCG was negative. A sterile speculum was placed in the patient's vagina and the cervix was prepped with Betadine. A single-toothed tenaculum was placed on the anterior lip of the cervix to stabilize it. Attempted to introduce pipelle into the endometrial cavity and met resistance at the internal cervical os. Attempted to dilate the cervix x2 with plastic dilators without success. Dr. Debroah Loop came to assist and was able to dilate the cervix and introduce the 3 mm pipelle into the endometrial cavity without difficulty to a depth of 8 cm, and a moderate amount of tissue was obtained and sent to pathology. The instruments were removed from the patient's  vagina. Minimal bleeding from the cervix was noted. The patient tolerated the procedure well. Routine post-procedure instructions were given to the patient. The patient will return in 2  weeks to discuss results and recommendations for further management.      Assessment & Plan:  Assessment: DUB Anemia  Plans: Endometrial biopsy today Patient instructed to take Ibuprofen as needed for discomfort today Patient instructed to continue with Iron and Megace until follow-up Patient will follow-up in 2 weeks with MD for results and management discussion Pamphlet on IUD given to patient today. Other surgical options discussed including ablation, myomectomy and hysterectomy. The patient is aware that it is at the discretion of the surgeon which options will be available based on [place] and biopsy results

## 2012-11-21 ENCOUNTER — Ambulatory Visit (INDEPENDENT_AMBULATORY_CARE_PROVIDER_SITE_OTHER): Payer: Self-pay | Admitting: Obstetrics & Gynecology

## 2012-11-21 ENCOUNTER — Encounter: Payer: Self-pay | Admitting: Obstetrics & Gynecology

## 2012-11-21 VITALS — BP 115/71 | HR 73 | Temp 97.0°F | Ht 59.0 in | Wt 166.7 lb

## 2012-11-21 DIAGNOSIS — N920 Excessive and frequent menstruation with regular cycle: Secondary | ICD-10-CM

## 2012-11-21 MED ORDER — MEGESTROL ACETATE 20 MG PO TABS: 40.0000 mg | ORAL_TABLET | Freq: Every day | ORAL | Status: DC

## 2012-11-21 NOTE — Patient Instructions (Addendum)
Menorragia (Menorrhagia) La hemorragia uterina (sangrado del tero) disfuncional es diferente del perodo menstrual normal. Cuando los perodos son irregulares o hay ms hemorragia que lo habitual en usted, se denomina menorragia. La causa puede ser un desequilibrio hormonal, fsico o metablico u otros problemas. Es necesario Medical sales representative examen para que el profesional pueda tratar [employer] causas que son tratables. Si el problema contina, ser necesario realizar una dilatacin y curetaje. Este procedimiento [employer] en dilatar el cuello del tero (la apertura del tero o matriz), es decir, se lo estira para Technical brewer, y se raspa la superficie que tapiza el interior del tero. Un especialista (patlogo) examina en el microscopio el tejido que se retira para asegurarse que no haya nada preocupante que requiera un examen ms exhaustivo. INSTRUCCIONES PARA EL CUIDADO DOMICILIARIO  Si el profesional que la asiste le prescribe medicamentos, tmelos tal como se le indic. No cambie ni reemplace medicamentos sin consultarlo con Mining engineer.  Las hemorragias de larga duracin pueden tener como consecuencia un dficit de hierro. El profesional que la asiste podr prescribirle comprimidos de hierro. Esto ayuda a Scientific laboratory technician que el organismo pierde luego de una hemorragia abundante. Tome los medicamentos tal como se le indic. El hierro puede causarle estreimiento. Si esto es un problema, aumente el consumo de [place], frutas y [place].  No tome aspirina o medicamentos que la contengan desde una semana antes del perodo menstrual ni durante el mismo. La aspirina puede hacer que la hemorragia empeore.  Si necesita cambiar el apsito o el tampn mas de una vez cada 2 horas, permanezca en cama y descanse todo lo posible hasta que la hemorragia se detenga.  Consuma alimentos balanceados. Coma alimentos ricos en hierro. Por ejemplo, verduras de [employer], carne, hgado, huevos y panes y Medical laboratory scientific officer de  grano integral. No trate de perder peso hasta que la hemorragia anormal se detenga y los niveles de hierro en la sangre vuelvan a la normalidad. SOLICITE ATENCIN MDICA SI:  Debe cambiar el apsito o el tampn ms de una vez cada hora.  Si tiene nuseas (ganas de vomitar), vmitos, mareos o diarrea mientras toma los medicamentos.  Tiene algn problema que pueda relacionarse con el medicamento que est tomando. SOLICITE ATENCIN MDICA DE INMEDIATO SI:  Tiene fiebre.  Siente escalofros.  Sufre una hemorragia intensa o comienza a eliminar cogulos de [place].  Se siente mareado o sufre un desmayo. EST SEGURO QUE:   Comprende las instrucciones para el alta mdica.  Controlar su enfermedad.  Solicitar atencin mdica de inmediato segn las indicaciones. Document Released: 05/25/2005 Document Revised: 11/07/2011 [facility] Patient Information 2013 [place], [place].

## 2012-11-21 NOTE — Progress Notes (Signed)
Subjective:     Patient ID: [name], female   DOB: 1966/03/24, 47 y.o.   MRN: [id number]  HPI Pt reports that she has not had bleeding since her Endobx.  She has remained on Megace.  Past Medical History  Diagnosis Date  . Blood transfusion without reported diagnosis   . Anemia    Past Surgical History  Procedure Laterality Date  . Cholecystectomy    . Cesarean section     No Known Allergies   Review of Systems     Objective:   Physical Exam BP 115/71  Pulse 73  Temp(Src) 97 F (36.1 C) (Oral)  Ht 4\' 11"  (1.499 m)  Wt 166 lb 11.2 oz (75.615 kg)  BMI 33.65 kg/m2  LMP 10/20/2012  exam deferred  11/01/2012 Diagnosis Endometrium, biopsy - BENIGN POLYPOID ENDOMETRIUM WITH PROGESTATIONAL EFFECT. - NO ATYPIA OR MALIGNANCY IDENTIFIED.    Assessment:     Irreg bleeding- improved on Megace.  Bx suggests polyps     Plan:     Keep Megace x 6 months F/u 6 months or sooner prn

## 2013-02-13 ENCOUNTER — Ambulatory Visit: Payer: Self-pay | Admitting: Obstetrics & Gynecology

## 2013-02-13 ENCOUNTER — Ambulatory Visit: Payer: Self-pay | Admitting: Advanced Practice Midwife

## 2013-03-13 ENCOUNTER — Ambulatory Visit: Payer: Self-pay | Admitting: Obstetrics & Gynecology

## 2013-03-13 ENCOUNTER — Ambulatory Visit: Payer: Self-pay | Admitting: Obstetrics and Gynecology

## 2013-05-29 ENCOUNTER — Ambulatory Visit: Payer: Self-pay | Admitting: Obstetrics & Gynecology

## 2013-06-11 IMAGING — CT CT ABD-PELV W/ CM
2 of 5 series · 17 of 46 positions shown, 19 images · IV contrast (OMNIPAQUE)
Comparison: Prior exam predates PACs and is unavailable for
comparison.

CLINICAL DATA: Lower abdominal pain, anemia, weakness

CT ABDOMEN AND PELVIS WITH CONTRAST
TECHNIQUE: Multidetector CT imaging of the abdomen and pelvis was
performed following the standard protocol during bolus
administration of intravenous contrast.
 Sagittal and coronal MPR images reconstructed from axial data set.
Contrast: 100mL OMNIPAQUE IOHEXOL 300 MG/ML  SOLN Dilute oral
contrast.

[Series 2: rtn a/p with · axial · 0.81mm/px · z∈[-472,-102]mm · 14 of 84 slices shown, 16 images]
[im 5/84  soft-tissue]
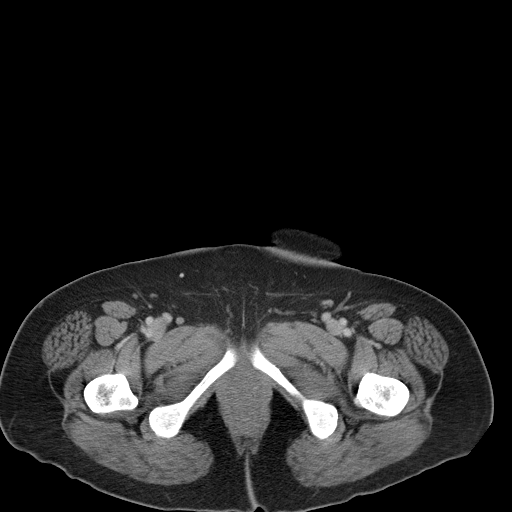
[im 5/84  bone]
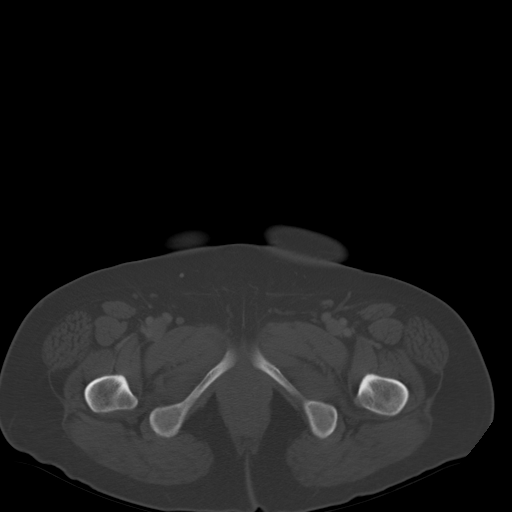
[im 10/84  soft-tissue]
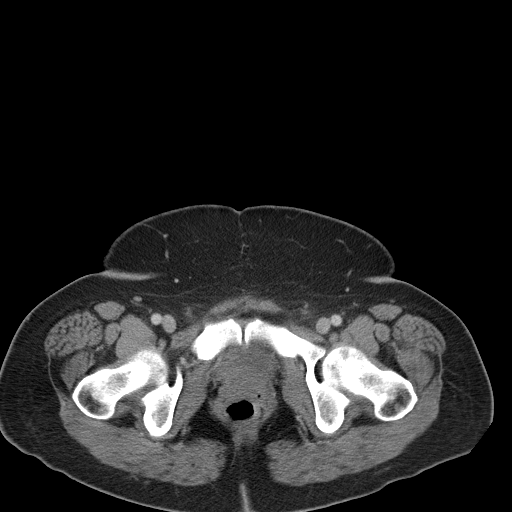
[im 19/84  soft-tissue]
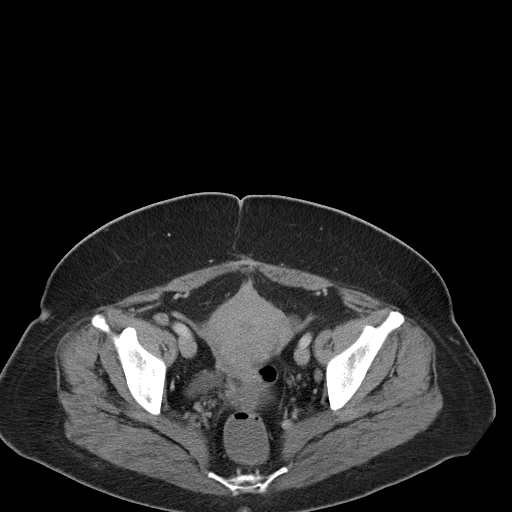
[im 24/84  soft-tissue]
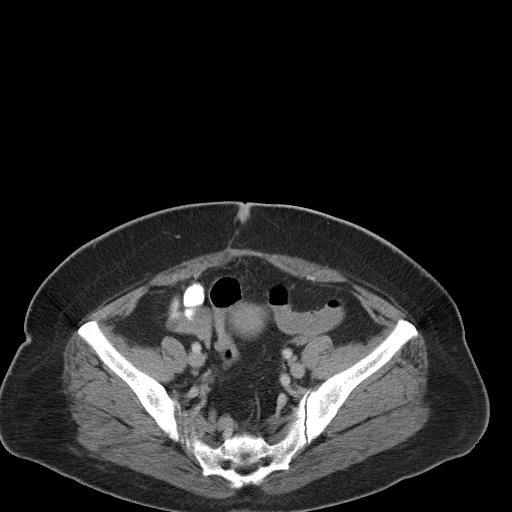
[im 28/84  soft-tissue]
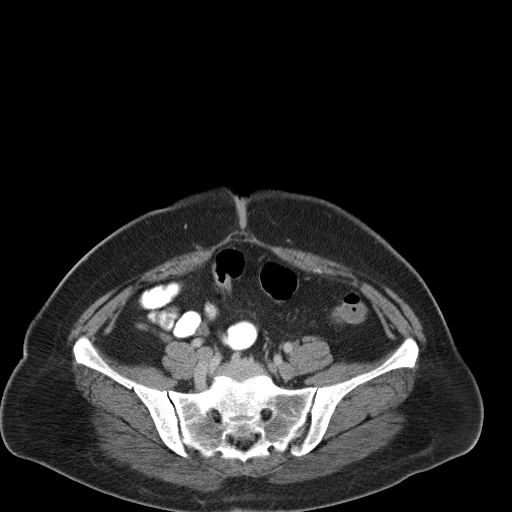
[im 33/84  soft-tissue]
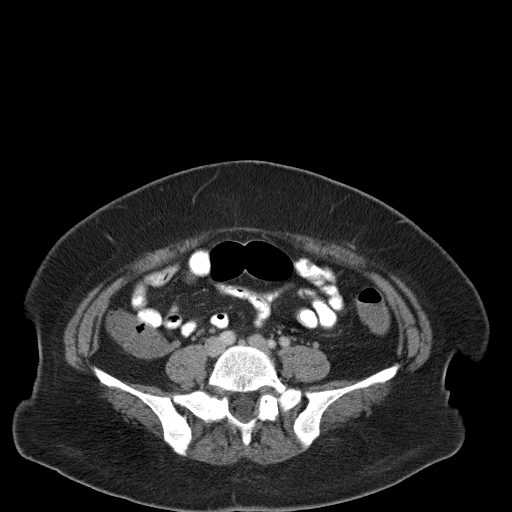
[im 37/84  soft-tissue]
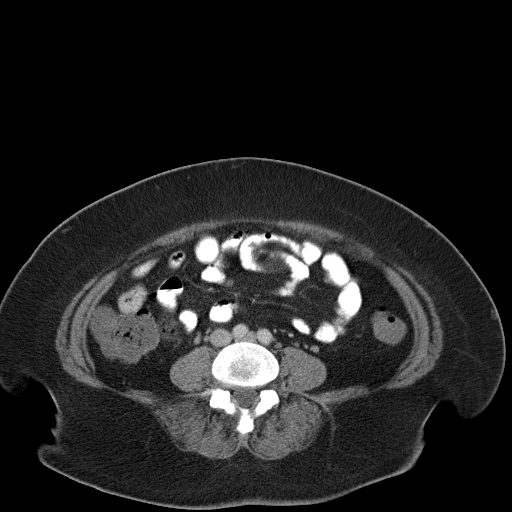
[im 47/84  soft-tissue]
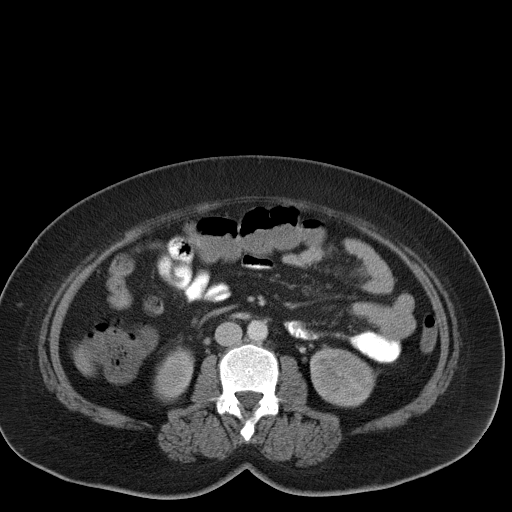
[im 51/84  soft-tissue]
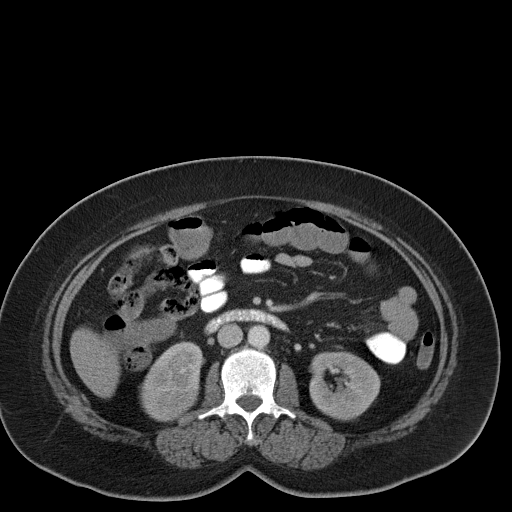
[im 51/84  bone]
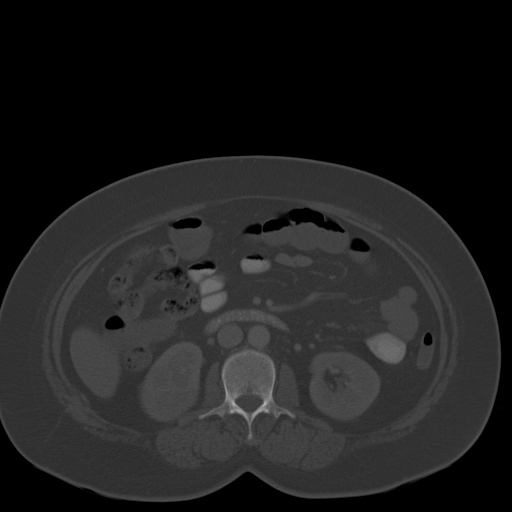
[im 56/84  soft-tissue]
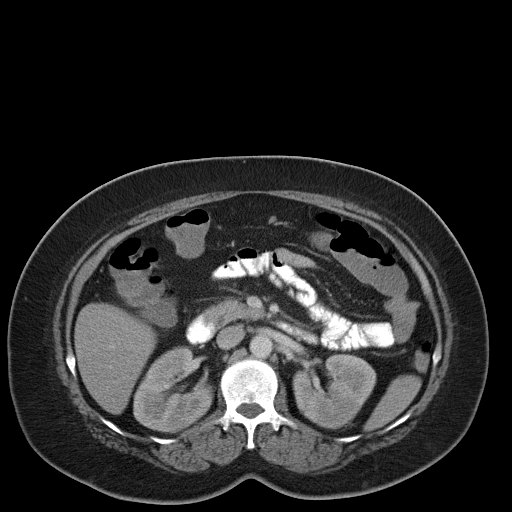
[im 60/84  soft-tissue]
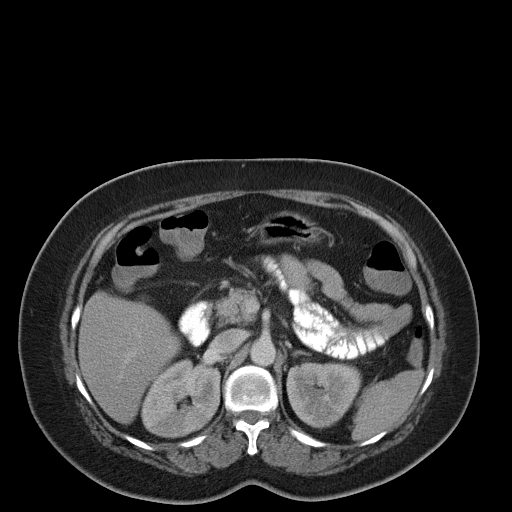
[im 65/84  soft-tissue]
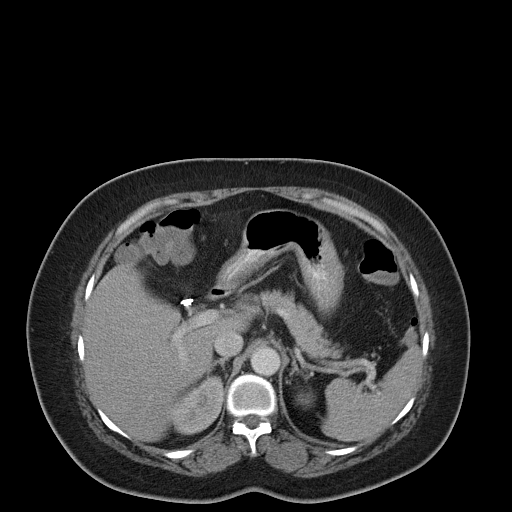
[im 74/84  soft-tissue]
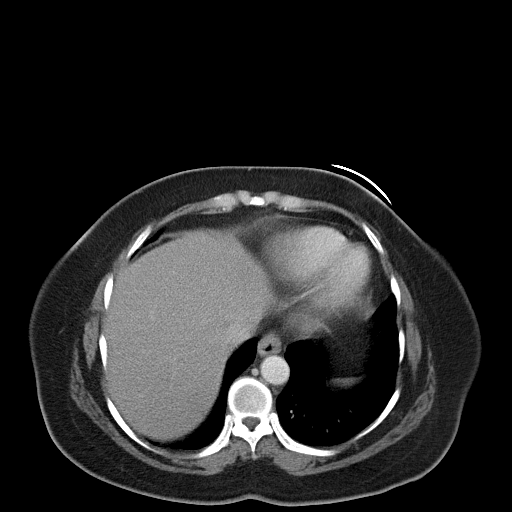
[im 79/84  soft-tissue]
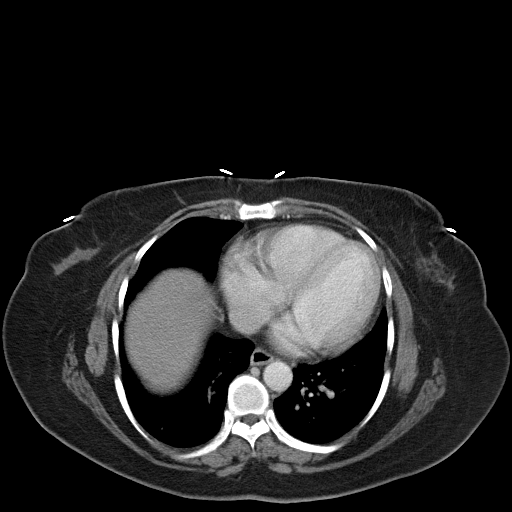

[Series 603: cor · coronal · 0.81mm/px · 3 of 127 slices shown]
[im 43/127  soft-tissue]
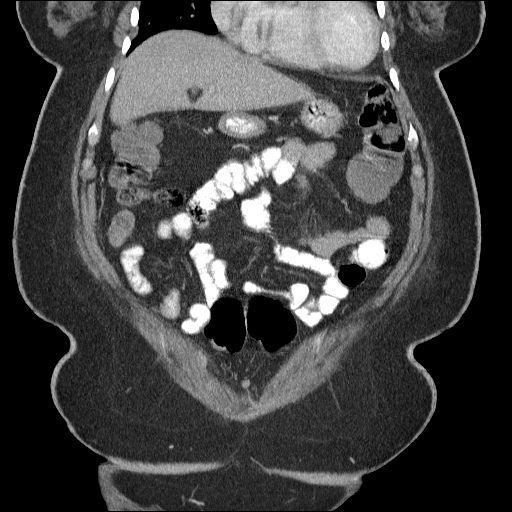
[im 57/127  soft-tissue]
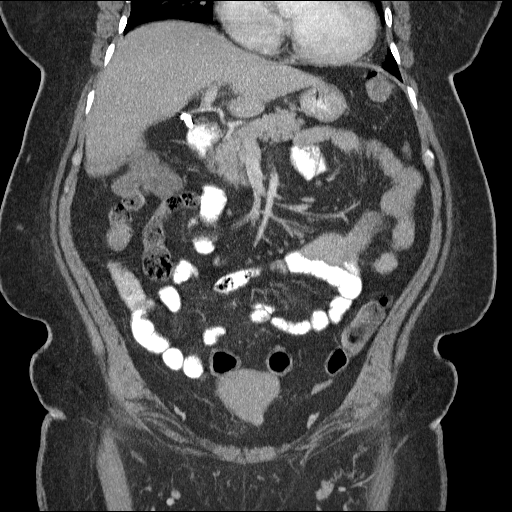
[im 71/127  soft-tissue]
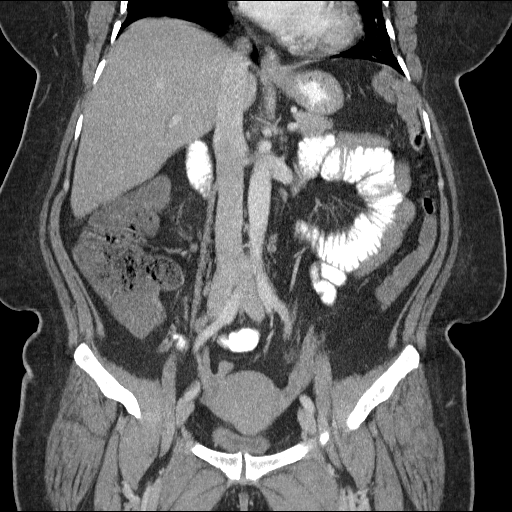

[17 of 46 positions shown; findings below may reference images not displayed]

FINDINGS: Lung bases clear.
Patchy appearance of the nephrograms bilaterally, nonspecific.
This can be seen with pyelonephritis and with infiltrative
processes.
Gallbladder surgically absent.
Liver, spleen, pancreas, and adrenal glands normal appearance.
Normal appendix.
Unremarkable bladder, uterus, and adnexae.
Stomach and bowel loops normal appearance.
No mass, adenopathy, free fluid, or inflammatory process.
No acute osseous findings.
IMPRESSION: Patchy bilateral nephrograms, question pyelonephritis, though
infiltrative processes are not entirely excluded.
Correlation with urinalysis recommended.
No other definite intra abdominal or intrapelvic abnormalities
identified.

## 2013-08-02 ENCOUNTER — Encounter (HOSPITAL_COMMUNITY): Payer: Self-pay | Admitting: *Deleted

## 2013-08-02 ENCOUNTER — Inpatient Hospital Stay (HOSPITAL_COMMUNITY)
Admission: AD | Admit: 2013-08-02 | Discharge: 2013-08-02 | Disposition: A | Discharge status: Home / Self Care | Payer: Self-pay | Source: Ambulatory Visit | Attending: Family Medicine | Admitting: Family Medicine

## 2013-08-02 DIAGNOSIS — N939 Abnormal uterine and vaginal bleeding, unspecified: Secondary | ICD-10-CM

## 2013-08-02 DIAGNOSIS — R109 Unspecified abdominal pain: Secondary | ICD-10-CM | POA: Insufficient documentation

## 2013-08-02 DIAGNOSIS — N938 Other specified abnormal uterine and vaginal bleeding: Secondary | ICD-10-CM | POA: Insufficient documentation

## 2013-08-02 DIAGNOSIS — N949 Unspecified condition associated with female genital organs and menstrual cycle: Secondary | ICD-10-CM | POA: Insufficient documentation

## 2013-08-02 DIAGNOSIS — N926 Irregular menstruation, unspecified: Secondary | ICD-10-CM

## 2013-08-02 LAB — URINALYSIS, ROUTINE W REFLEX MICROSCOPIC
Leukocytes, UA: NEGATIVE
Nitrite: NEGATIVE
Specific Gravity, Urine: <1.005 — ABNORMAL LOW (ref 1.005–1.030)
Urobilinogen, UA: 0.2 mg/dL (ref 0.0–1.0)
pH: 6 (ref 5.0–8.0)

## 2013-08-02 LAB — CBC WITH DIFFERENTIAL/PLATELET
Basophils Relative: 1 % (ref 0–1)
Eosinophils Absolute: 0.3 10*3/uL (ref 0.0–0.7)
Eosinophils Relative: 3 % (ref 0–5)
Hemoglobin: 12.3 g/dL (ref 12.0–15.0)
Lymphs Abs: 3.9 10*3/uL (ref 0.7–4.0)
MCH: 29.9 pg (ref 26.0–34.0)
MCHC: 34.2 g/dL (ref 30.0–36.0)
MCV: 87.4 fL (ref 78.0–100.0)
Monocytes Relative: 11 % (ref 3–12)
Neutrophils Relative %: 42 % — ABNORMAL LOW (ref 43–77)
Platelets: 278 10*3/uL (ref 150–400)
RBC: 4.12 MIL/uL (ref 3.87–5.11)

## 2013-08-02 LAB — URINE MICROSCOPIC-ADD ON

## 2013-08-02 MED ORDER — MEGESTROL ACETATE 40 MG PO TABS: 40.0000 mg | ORAL_TABLET | Freq: Every day | ORAL | Status: DC

## 2013-08-02 NOTE — MAU Provider Note (Signed)
History     CSN: [id number]  Arrival date and time: 08/02/13 1326   None     Chief Complaint  Patient presents with  . Vaginal Bleeding   Vaginal Bleeding    [name] is a 47 y.o. [phone] who presents today with abnormal uterine bleeding. She has been followed for this in the clinic, and has cancelled her most recent FU appointments. She states that for the last two weeks she has been bleeding. She states that the bleeding has been like a period, and she has had cramping.   Past Medical History  Diagnosis Date  . Blood transfusion without reported diagnosis   . Anemia     Past Surgical History  Procedure Laterality Date  . Cholecystectomy    . Cesarean section      Family History  Problem Relation Age of Onset  . Other Mother   . Other Father   . Alcohol abuse Paternal Uncle     History  Substance Use Topics  . Smoking status: Never Smoker   . Smokeless tobacco: Never Used  . Alcohol Use: No    Allergies: No Known Allergies  Prescriptions prior to admission  Medication Sig Dispense Refill  . ferrous sulfate 325 (65 FE) MG EC tablet Take 325 mg by mouth daily with breakfast.      . naproxen sodium (ANAPROX) 220 MG tablet Take 440 mg by mouth 2 (two) times daily as needed (for pain).        Review of Systems  Genitourinary: Positive for vaginal bleeding.   Physical Exam   Blood pressure 111/65, pulse 85, temperature 98.4 F (36.9 C), temperature source Oral, resp. rate 18, height 4\' 8"  (1.422 m), weight 83.19 kg (183 lb 6.4 oz), last menstrual period 07/19/2013, SpO2 99.00%.  Physical Exam  Nursing note and vitals reviewed. Constitutional: She is oriented to person, place, and time. She appears well-developed and well-nourished. No distress.  Cardiovascular: Normal rate.   Respiratory: Effort normal.  GI: Soft. There is no tenderness.  Neurological: She is alert and oriented to person, place, and time.  Skin: Skin is warm and dry.   Psychiatric: She has a normal mood and affect.    MAU Course  Procedures  Results for orders placed during the hospital encounter of 08/02/13 (from the past 24 hour(s))  URINALYSIS, ROUTINE W REFLEX MICROSCOPIC     Status: Abnormal   Collection Time    08/02/13  2:15 PM      Result Value Range   Color, Urine YELLOW  YELLOW   APPearance CLEAR  CLEAR   Specific Gravity, Urine <1.005 (*) 1.005 - 1.030   pH 6.0  5.0 - 8.0   Glucose, UA NEGATIVE  NEGATIVE mg/dL   Hgb urine dipstick LARGE (*) NEGATIVE   Bilirubin Urine NEGATIVE  NEGATIVE   Ketones, ur NEGATIVE  NEGATIVE mg/dL   Protein, ur NEGATIVE  NEGATIVE mg/dL   Urobilinogen, UA 0.2  0.0 - 1.0 mg/dL   Nitrite NEGATIVE  NEGATIVE   Leukocytes, UA NEGATIVE  NEGATIVE  URINE MICROSCOPIC-ADD ON     Status: Abnormal   Collection Time    08/02/13  2:15 PM      Result Value Range   Squamous Epithelial / LPF RARE  RARE   RBC / HPF 3-6  <3 RBC/hpf   Bacteria, UA FEW (*) RARE  POCT PREGNANCY, URINE     Status: None   Collection Time    08/02/13  2:38 PM  Result Value Range   Preg Test, Ur NEGATIVE  NEGATIVE  CBC WITH DIFFERENTIAL     Status: Abnormal   Collection Time    08/02/13  3:52 PM      Result Value Range   WBC 8.8  4.0 - 10.5 K/uL   RBC 4.12  3.87 - 5.11 MIL/uL   Hemoglobin 12.3  12.0 - 15.0 g/dL   HCT 16.1  09.6 - 04.5 %   MCV 87.4  78.0 - 100.0 fL   MCH 29.9  26.0 - 34.0 pg   MCHC 34.2  30.0 - 36.0 g/dL   RDW 40.9  81.1 - 91.4 %   Platelets 278  150 - 400 K/uL   Neutrophils Relative % 42 (*) 43 - 77 %   Neutro Abs 3.7  1.7 - 7.7 K/uL   Lymphocytes Relative 44  12 - 46 %   Lymphs Abs 3.9  0.7 - 4.0 K/uL   Monocytes Relative 11  3 - 12 %   Monocytes Absolute 0.9  0.1 - 1.0 K/uL   Eosinophils Relative 3  0 - 5 %   Eosinophils Absolute 0.3  0.0 - 0.7 K/uL   Basophils Relative 1  0 - 1 %   Basophils Absolute 0.0  0.0 - 0.1 K/uL     Assessment and Plan   1. Abnormal uterine bleeding (AUB)    FU with the  clinic Consider IUD  Megace taper for now    Tawnya Crook 08/02/2013, 4:07 PM

## 2013-08-02 NOTE — MAU Note (Signed)
Patient states she has a history of abnormal bleeding and is seen at the [facility]. States she has been bleeding for 2 weeks heavy. States she is out of her medications and wants refills. Having abdominal pain for 2 weeks with the bleeding.

## 2013-08-02 NOTE — MAU Provider Note (Signed)
Chart reviewed and agree with management and plan.  

## 2013-08-28 IMAGING — US US PELVIS COMPLETE
1 series · 14 of 25 positions shown · non-contrast
Comparison: None

CLINICAL DATA: Vaginal bleeding, pelvic pain



[Series 1: us pelvis complete · 0.28mm/px · 14 of 41 slices shown]
[im 1/41]
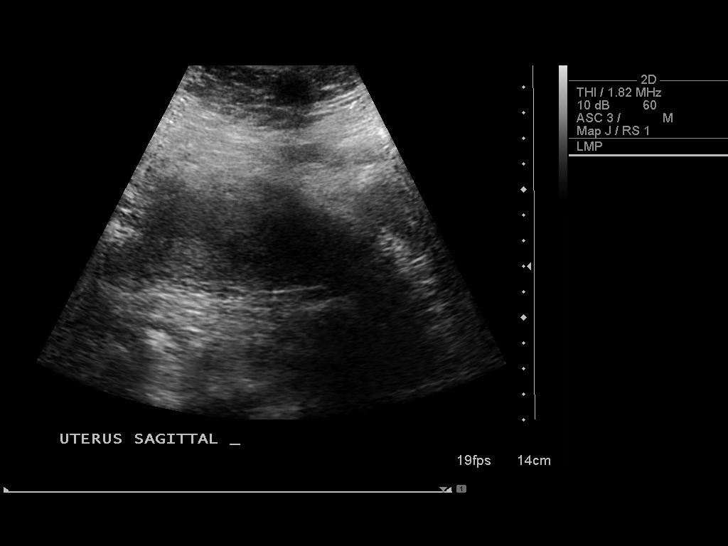
[im 4/41]
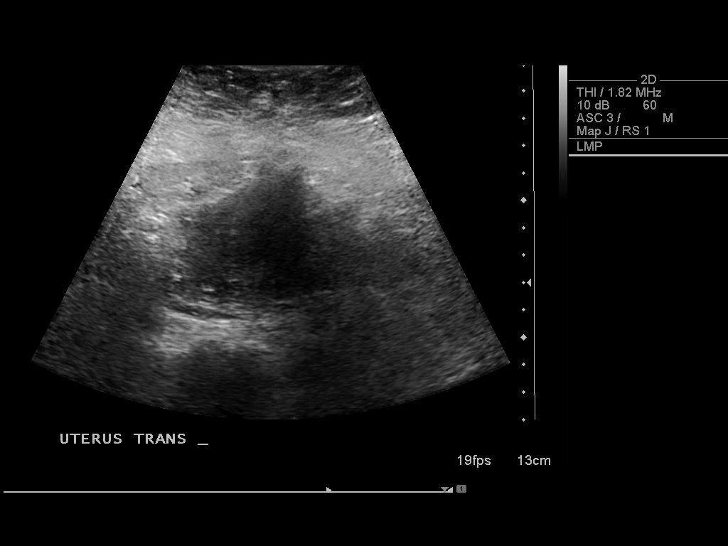
[im 7/41]
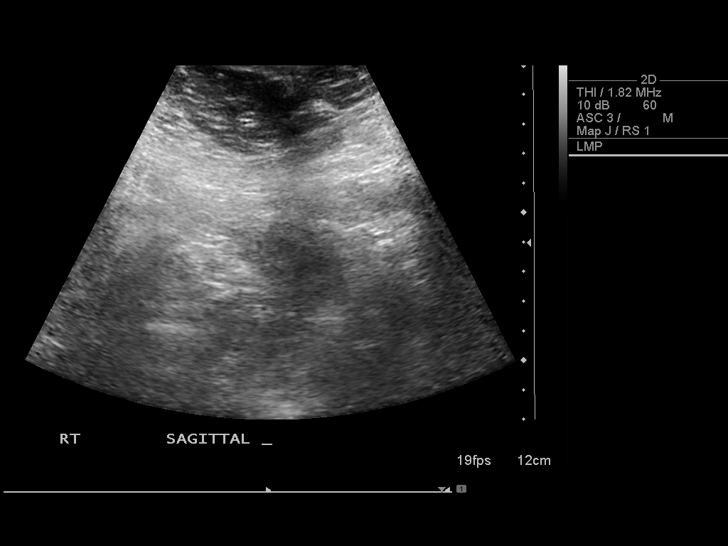
[im 11/41]
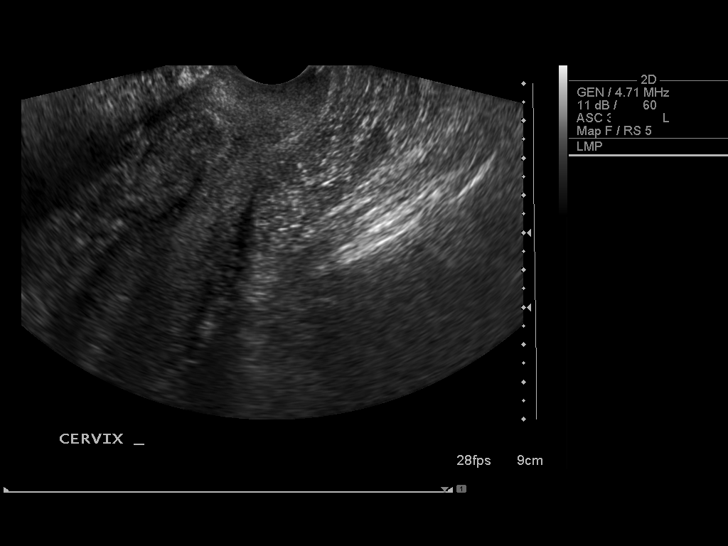
[im 14/41]
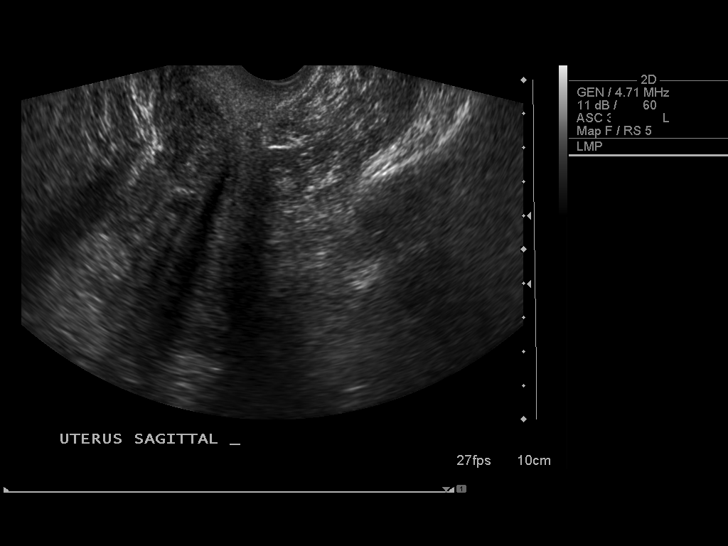
[im 16/41]
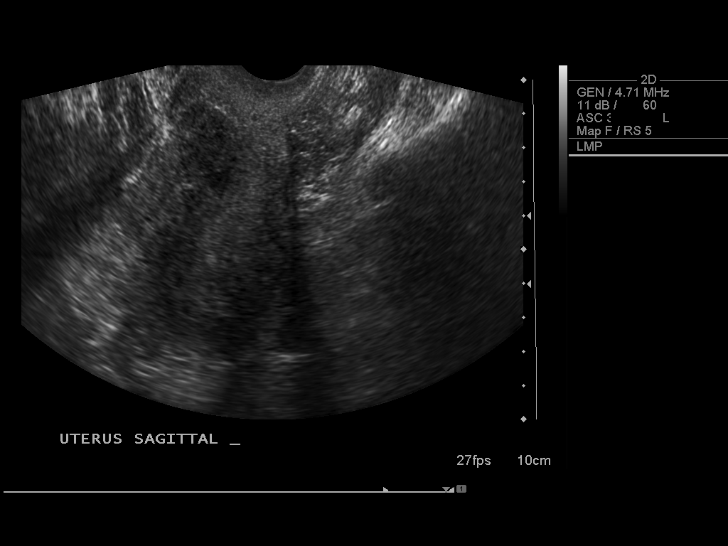
[im 19/41]
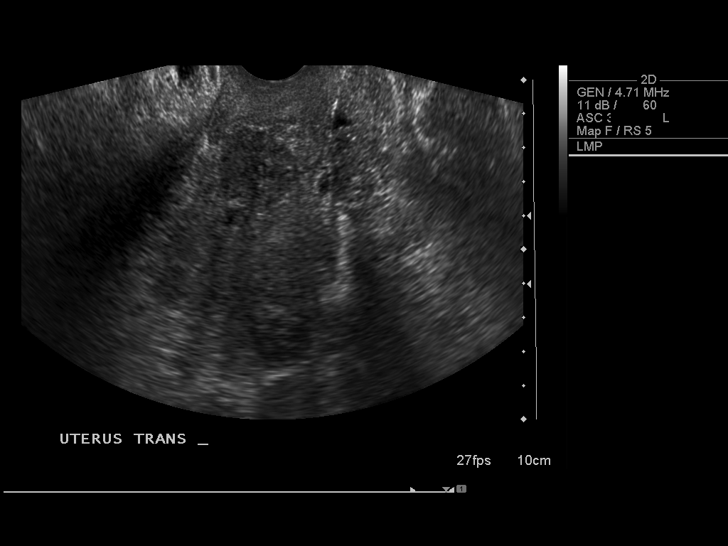
[im 22/41]
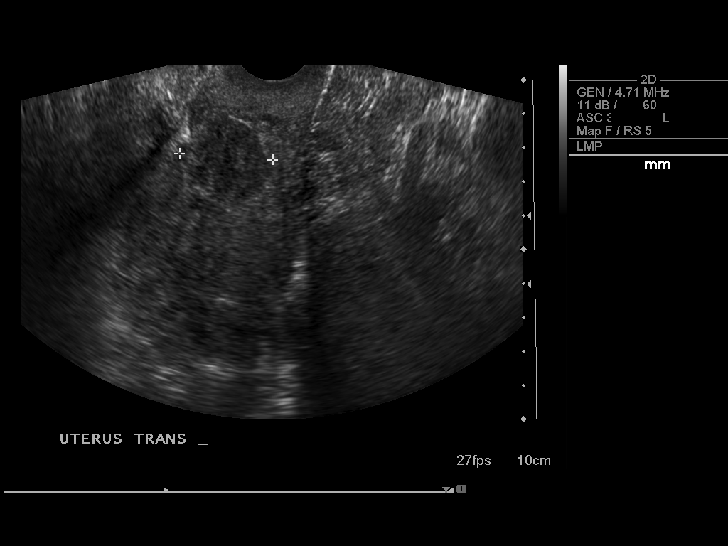
[im 26/41]
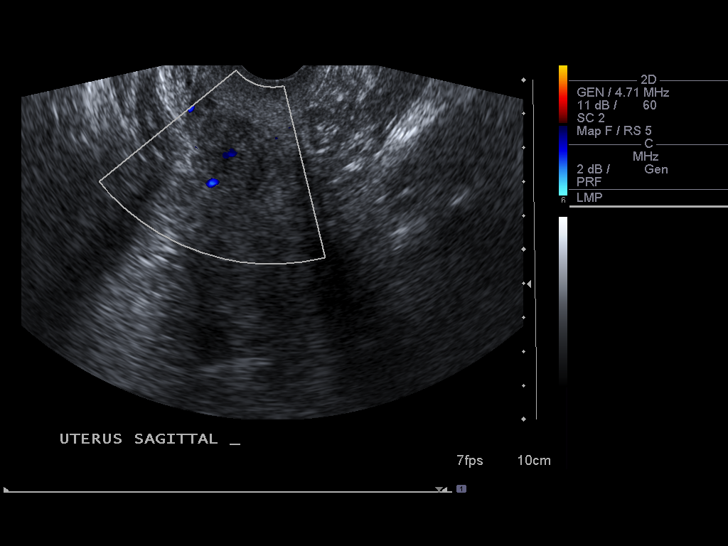
[im 27/41]
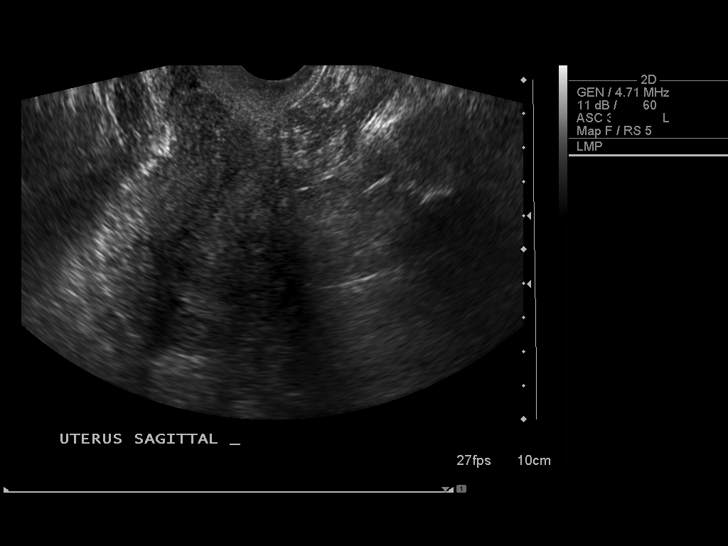
[im 31/41]
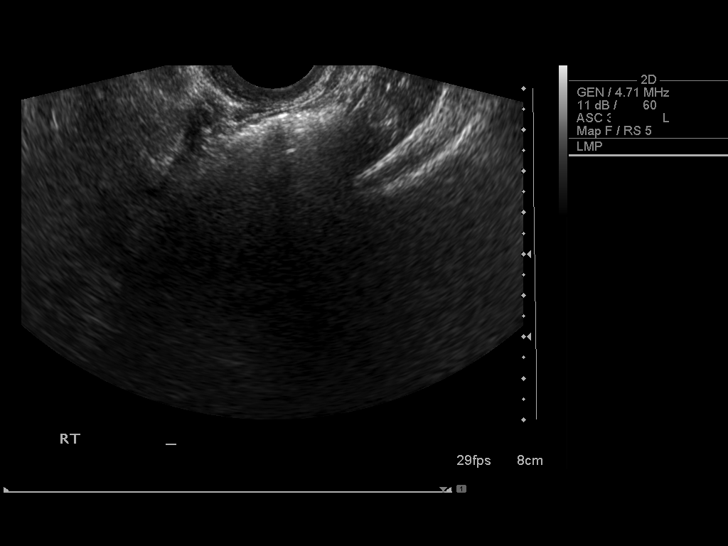
[im 34/41]
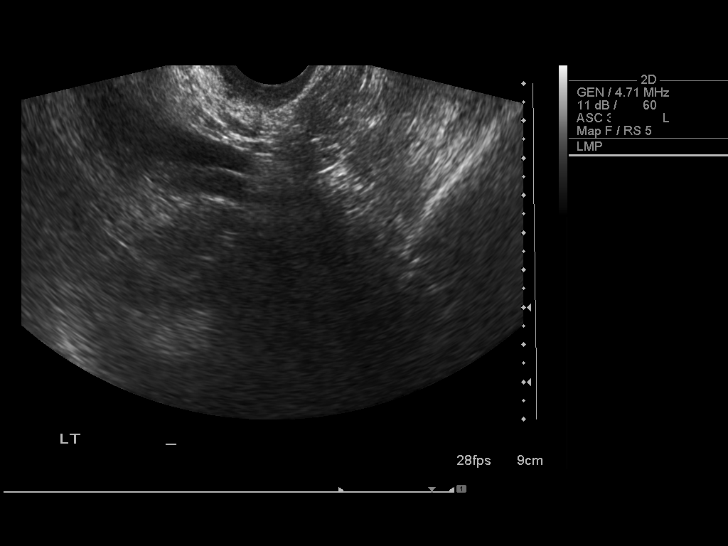
[im 37/41]
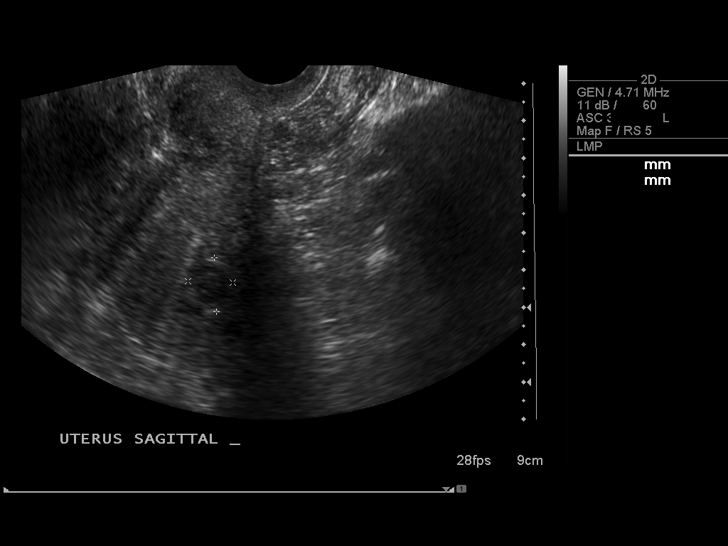
[im 41/41]
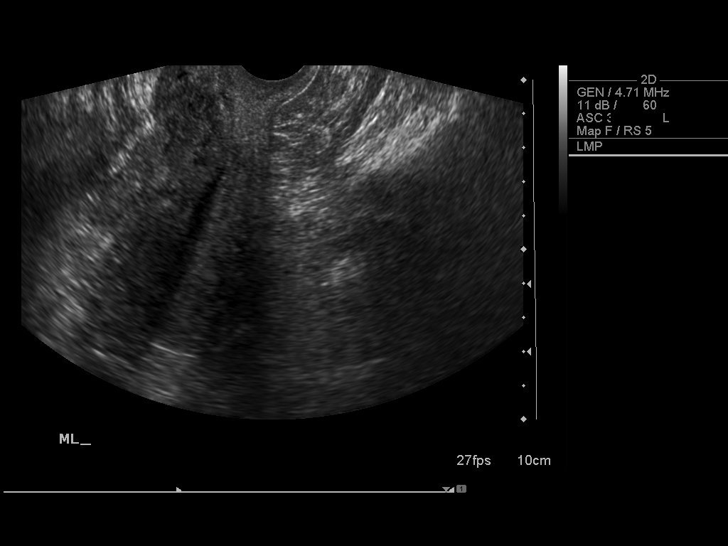

[14 of 25 positions shown; findings below may reference images not displayed]

FINDINGS: Uterus: Measures 9.2 x 5.4 x 6.2 cm.  3.1 x 2.2 x 2.8 cm submucosal
fibroid in the of the lower uterine segment.  1.5 x 1.2 x 1.4 cm
submucosal fibroid in the left uterine fundus.

Endometrium: Measures 11 mm in thickness.

Right ovary:  Not visualized transabdominally/transvaginally.

Left ovary: Not visualized transabdominally/transvaginally.

Other findings: No free fluid.
IMPRESSION: Two submucosal uterine fibroids measuring up to 3.1 cm.

Endometrium measures 11 mm in thickness.

Bilateral ovaries are not discretely visualized
transabdominally/transvaginally.

## 2013-09-20 ENCOUNTER — Ambulatory Visit (INDEPENDENT_AMBULATORY_CARE_PROVIDER_SITE_OTHER): Payer: Self-pay | Admitting: Medical

## 2013-09-20 ENCOUNTER — Encounter: Payer: Self-pay | Admitting: Medical

## 2013-09-20 VITALS — BP 117/77 | HR 74 | Temp 97.9°F | Wt 181.7 lb

## 2013-09-20 DIAGNOSIS — N926 Irregular menstruation, unspecified: Secondary | ICD-10-CM

## 2013-09-20 DIAGNOSIS — N939 Abnormal uterine and vaginal bleeding, unspecified: Secondary | ICD-10-CM

## 2013-09-20 NOTE — Progress Notes (Signed)
Pt. Here today because her heavy bleeding has returned. States she had began bleeding three weeks before she had gone to MAU in December. Megace given there, helped for a few weeks. Since then she has run out and bleeding has returned; spotting now.

## 2013-09-20 NOTE — Patient Instructions (Signed)
Sangrado uterino anormal  (Abnormal Uterine Bleeding)  Sangrado uterino anormal significa que hay un sangrado por la vagina que no es su período menstrual normal. Puede ser:  · Pérdidas de sangre o hemorragias entre los períodos.  · Hemorragias luego de tener sexo (relaciones sexuales).  · Sangrado abundante o más que lo habitual.  · Períodos que duran más que lo normal.  · Sangrado luego de la menopausia.  Hay muchos problemas que pueden ser la causa. El tratamiento dependerá de la causa del sangrado. Cualquier tipo de sangrado que no sea normal debe consultarse con el médico.   CUIDADOS EN EL HOGAR  Controle su afección para ver si hay cambios. Estas indicaciones podrán disminuir cualquier molestia que tenga:  · No use tampones ni duchas vaginales o como le haya indicado el médico.  · Cambie los apósitos con frecuencia.  Deberá hacerse exámenes pélvicos regulares y pruebas de Papanicolaou. Realice los estudios indicados según le indique su médico.  SOLICITE AYUDA SI:  · El sangrado dura más de 1 semana.  · Se siente mareada por momentos.  SOLICITE AYUDA DE INMEDIATO SI:   · Se desmaya.  · Tiene que cambiarse los apósitos cada 15 a 30 minutos.  · Siente dolor en el abdomen.  · Tiene fiebre.  · Se siente débil o presenta sudoración.  · Elimina coágulos grandes por la vagina.  · Siente malestar estomacal (náuseas) y devuelve (vomita).  ASEGÚRESE DE QUE:  · Comprende estas instrucciones.  · Controlará su afección.  · Recibirá ayuda de inmediato si no mejora o si empeora.  Document Released: 09/17/2010 Document Revised: 06/05/2013  Exit[employer]® Patient Information ©2014 [employer].

## 2013-09-20 NOTE — Progress Notes (Signed)
Patient ID: [name], female   DOB: 13-Jul-1966, 48 y.o.   MRN: [id number]  History:  [name]  is a 48 y.o. [phone] who presents to clinic today for abnormal uterine bleeding. The patient states that she had not had bleeding x months and then started bleeding prior to recent MAU visit. She states that the bleeding was heavy at that time and she was given Megace. The patient states that the megace stopped the bleeding x 3 weeks. She recently started bleeding lightly x 1 week. She is only spotting today. She denies pain, weakness, dizziness or fatigue.  The following portions of the patient's history were reviewed and updated as appropriate: allergies, current medications, past family history, past medical history, past social history, past surgical history and problem list.  Review of Systems:  Pertinent items are noted in HPI.  Objective:  Physical Exam BP 117/77  Pulse 74  Temp(Src) 97.9 F (36.6 C) (Oral)  Wt 181 lb 11.2 oz (82.419 kg) GENERAL: Well-developed, well-nourished female in no acute distress.  HEENT: Normocephalic, atraumatic.  LUNGS: Normal effort HEART: Regular rate EXTREMITIES: No cyanosis, clubbing, or edema SKIN: Warm, dry and without erythema  Labs and Imaging Endometrial biopsy 10/2012 - negative CBC - 08/02/13 - WNL  MDM Discussed options for bleeding control with the patient including Depo provera and Mirena IUD.  The patient is interested in the Mirena IUD at this time. Patient is self-pay. [employer] [employer] paperwork completed Assessment & Plan:  Assessment: Abnormal uterine bleeding  Plans: 1. Patient completed [employer] paperwork today 2. Patient will be contacted when paperwork is approved and device is received 3. Patient is aware of cost of insertion 4. [facility] or [facility] to [facility] for further evaluation if heavy bleeding resumes prior to IUD insertion 5. [facility]  or PRN  Freddi StarrJulie N Ethier, PA-C 09/20/2013 10:42 AM

## 2014-06-30 ENCOUNTER — Encounter: Payer: Self-pay | Admitting: Medical

## 2015-06-18 ENCOUNTER — Ambulatory Visit (INDEPENDENT_AMBULATORY_CARE_PROVIDER_SITE_OTHER): Payer: Self-pay | Admitting: Internal Medicine

## 2015-06-18 ENCOUNTER — Encounter: Payer: Self-pay | Admitting: Internal Medicine

## 2015-06-18 VITALS — BP 118/74 | HR 74 | Ht <= 58 in | Wt 152.0 lb

## 2015-06-18 DIAGNOSIS — F32A Depression, unspecified: Secondary | ICD-10-CM

## 2015-06-18 DIAGNOSIS — R300 Dysuria: Secondary | ICD-10-CM

## 2015-06-18 DIAGNOSIS — F329 Major depressive disorder, single episode, unspecified: Secondary | ICD-10-CM

## 2015-06-18 LAB — POCT URINALYSIS DIPSTICK
Bilirubin, UA: NEGATIVE
Glucose, UA: NEGATIVE
Ketones, UA: NEGATIVE
Nitrite, UA: NEGATIVE
PH UA: 7
SPEC GRAV UA: 1.02
UROBILINOGEN UA: 0.2

## 2015-06-18 MED ORDER — CITALOPRAM HYDROBROMIDE 20 MG PO TABS: ORAL_TABLET | ORAL | Status: DC

## 2015-06-18 MED ORDER — SULFAMETHOXAZOLE-TRIMETHOPRIM 800-160 MG PO TABS: ORAL_TABLET | ORAL | Status: DC

## 2015-06-18 NOTE — Patient Instructions (Signed)
[facility] clinic if you have suicidal thoughts and stop medicine immediately:  Explained to patient in Spanish to call clinic number even after hours.
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
## 2015-06-20 LAB — URINE CULTURE

## 2015-06-24 NOTE — Progress Notes (Signed)
Quick Note:  06/24/15 3:39 pm. Patient aware of results. Urine symptoms are resolved. ______

## 2015-07-02 ENCOUNTER — Ambulatory Visit: Payer: Self-pay | Admitting: Internal Medicine

## 2015-07-02 ENCOUNTER — Ambulatory Visit (INDEPENDENT_AMBULATORY_CARE_PROVIDER_SITE_OTHER): Payer: Self-pay | Admitting: Licensed Clinical Social Worker

## 2015-07-02 DIAGNOSIS — F32A Depression, unspecified: Secondary | ICD-10-CM

## 2015-07-02 DIAGNOSIS — F329 Major depressive disorder, single episode, unspecified: Secondary | ICD-10-CM
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
## 2015-07-06 ENCOUNTER — Other Ambulatory Visit: Payer: Self-pay | Admitting: Licensed Clinical Social Worker

## 2015-07-07 ENCOUNTER — Ambulatory Visit (INDEPENDENT_AMBULATORY_CARE_PROVIDER_SITE_OTHER): Payer: Self-pay | Admitting: Licensed Clinical Social Worker

## 2015-07-07 DIAGNOSIS — F329 Major depressive disorder, single episode, unspecified: Secondary | ICD-10-CM

## 2015-07-07 DIAGNOSIS — F32A Depression, unspecified: Secondary | ICD-10-CM
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
## 2015-07-14 ENCOUNTER — Ambulatory Visit (INDEPENDENT_AMBULATORY_CARE_PROVIDER_SITE_OTHER): Payer: Self-pay | Admitting: Licensed Clinical Social Worker

## 2015-07-14 DIAGNOSIS — F321 Major depressive disorder, single episode, moderate: Secondary | ICD-10-CM

## 2015-07-14 NOTE — Progress Notes (Signed)
   THERAPY PROGRESS NOTE  Session Time: 60 minutes  Participation Level: Active  Behavioral Response: Casual and Fairly GroomedAlertEuthymic  Type of Therapy: Individual Therapy  Treatment Goals addressed: Communication: Ecomap/family systems assessment  Interventions: Motivational Interviewing and Strength-based  Summary: [name] is a 49 y.o. female who presents with a positive mood and appropriate affect. [name] reported that she was feeling much better than last week, as she has had fewer crying spells and depressive symptoms. She scored a 3 on the PHQ-9 depression assessment, indicating minimal depressive symptoms. She reported that she felt the antidepressant combined with counseling was working very well for her. [name] engaged in the [employer] assessment, openly sharing her feelings regarding the most important relationships in her life. She created her Ecomap to include her three sons, her parents, her two grandchildren, her boyfriend, her ex-husband, best friend, and her dream of "fixing her documents." [name] shared that she has positive relationships with all three of her grown sons, but that there is also significant stress. She shared that her youngest son tends to be jealous and interfering when she dates. Her middle son at times does not pay attention to helping her. She reported that her oldest son lives in [place] and often asks her to send money, which can cause her stress. She shared that she loves her grandchildren in [place] even though she has never met them; they talk on the phone regularly and they "take away her stress." [name] shared that she feels good about her relationship with her parents but that she cannot be open with them because they are very conservative and judgmental. [name] shared at length about her relationship with her boyfriend, who she has mixed feelings about. She reported that he drinks heavily and still has another girlfriend in [place]  but that he wants her to spend more time with him. She shared that she "wants her space" and that she feels she loves him less than she used to. She reported that she would like to get pregnant but does not know if she can.  Suicidal/Homicidal: Nowithout intent/plan  Therapist Response: LCSW utilized supportive counseling techniques to validate and affirm [name]'s feelings. LCSW provided emotional support as [name] shared about her current stressors. LCSW completed a PHQ-9 with [name] to assess her current depressive symptoms. LCSW engaged [name] in [place] intervention to assess her support system and life domains. LCSW reflected on [name]'s frustrations and stress with her sons. LCSW encouraged her to process her reasons for wanting to stay with her boyfriend and her reasons to leave him. LCSW and [name] discussed her desire to have a baby with him even though she does not know if she wants to stay together.  Plan: Return again in 1 weeks.  Diagnosis: Axis I: Major Depression, single episode    Axis II: No diagnosis    Metta Clines, LCSW 07/14/2015

## 2015-07-21 ENCOUNTER — Encounter (INDEPENDENT_AMBULATORY_CARE_PROVIDER_SITE_OTHER): Payer: Self-pay

## 2015-07-21 ENCOUNTER — Ambulatory Visit (INDEPENDENT_AMBULATORY_CARE_PROVIDER_SITE_OTHER): Payer: Self-pay | Admitting: Internal Medicine

## 2015-07-21 ENCOUNTER — Ambulatory Visit (INDEPENDENT_AMBULATORY_CARE_PROVIDER_SITE_OTHER): Payer: Self-pay | Admitting: Licensed Clinical Social Worker

## 2015-07-21 ENCOUNTER — Encounter: Payer: Self-pay | Admitting: Internal Medicine

## 2015-07-21 VITALS — BP 120/70 | HR 72 | Ht <= 58 in | Wt 152.5 lb

## 2015-07-21 DIAGNOSIS — F329 Major depressive disorder, single episode, unspecified: Secondary | ICD-10-CM

## 2015-07-21 DIAGNOSIS — F32A Depression, unspecified: Secondary | ICD-10-CM

## 2015-07-21 DIAGNOSIS — F321 Major depressive disorder, single episode, moderate: Secondary | ICD-10-CM

## 2015-07-21 NOTE — Patient Instructions (Signed)
Do not miss your medicine ever
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
## 2015-07-28 ENCOUNTER — Ambulatory Visit (INDEPENDENT_AMBULATORY_CARE_PROVIDER_SITE_OTHER): Payer: Self-pay | Admitting: Licensed Clinical Social Worker

## 2015-07-28 DIAGNOSIS — F321 Major depressive disorder, single episode, moderate: Secondary | ICD-10-CM
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
## 2015-08-04 ENCOUNTER — Ambulatory Visit (INDEPENDENT_AMBULATORY_CARE_PROVIDER_SITE_OTHER): Payer: Self-pay | Admitting: Licensed Clinical Social Worker

## 2015-08-04 DIAGNOSIS — F321 Major depressive disorder, single episode, moderate: Secondary | ICD-10-CM
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
## 2015-08-14 ENCOUNTER — Ambulatory Visit (INDEPENDENT_AMBULATORY_CARE_PROVIDER_SITE_OTHER): Payer: Self-pay | Admitting: Licensed Clinical Social Worker

## 2015-08-14 DIAGNOSIS — F321 Major depressive disorder, single episode, moderate: Secondary | ICD-10-CM

## 2015-08-14 NOTE — Progress Notes (Signed)
   THERAPY PROGRESS NOTE  Session Time: 60min  Participation Level: Active  Behavioral Response: Casual and Fairly GroomedAlertDepressed  Type of Therapy: Individual Therapy  Treatment Goals addressed: Coping  Interventions: Solution Focused and Supportive  Summary: Burton ApleyRoselia A Vanterpool is a 49 y.o. female who presents with a depressed mood and appropriate affect. She shared that her life "has been bad." She reported that she had spent almost a whole week with her boyfriend, which meant that she did not sleep well and did not feel happy. Caitlen and LCSW processed about the pros and cons of being in the relationship. Jaymi expressed fears and worries regarding being alone but acknowledged that the relationship did not make her feel good. She reported that her boyfriend continues to drink heavily and speak to his girlfriend in [place] in front of her. She shared that she tried to break up with him recently but that he cried until she "felt so much pity for him." Keisy reported that she feels her current level of self-esteem is a 5 out of 10. She was able to state five positive self-affirmations regarding her most positive qualities. She shared that her dreams for her future in the next year or two would be to have a stable, responsible partner and live in a comfortable house. She reported that she is taking her antidepressant medication daily and that her crying spells have decreased significantly.  Suicidal/Homicidal: Nowithout intent/plan  Therapist Response: LCSW used supportive counseling techniques as well as Solution-Focused Therapy throughout the session. LCSW reflected on the motivations that Edelyn expressed about why she wants to leave her boyfriend. LCSW asked Amaal to scale her current level of self-esteem. LCSW asked Marlenne to state 5 positive self-affirmations. LCSW noted that Sharonne became briefly positive and upbeat while sharing about the characteristics she likes about  herself. LCSW asked Ameli to imagine what her future might look like. LCSW and Safiya discussed what characteristics in a partner would be a good fit for her imagined future.   Plan: Return again in 2 weeks.  Diagnosis: Axis I: Major Depression, single episode    Axis II: No diagnosis    Nilda Simmeratosha Rayan Dyal, LCSW 08/14/2015

## 2015-08-20 ENCOUNTER — Ambulatory Visit: Payer: Self-pay | Admitting: Internal Medicine

## 2015-09-01 ENCOUNTER — Other Ambulatory Visit: Payer: Self-pay | Admitting: Licensed Clinical Social Worker

## 2015-09-04 ENCOUNTER — Ambulatory Visit: Payer: Self-pay | Admitting: Internal Medicine

## 2015-09-11 ENCOUNTER — Ambulatory Visit (INDEPENDENT_AMBULATORY_CARE_PROVIDER_SITE_OTHER): Payer: Self-pay | Admitting: Licensed Clinical Social Worker

## 2015-09-11 DIAGNOSIS — F321 Major depressive disorder, single episode, moderate: Secondary | ICD-10-CM
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
## 2015-09-22 ENCOUNTER — Ambulatory Visit (INDEPENDENT_AMBULATORY_CARE_PROVIDER_SITE_OTHER): Payer: Self-pay | Admitting: Licensed Clinical Social Worker

## 2015-09-22 DIAGNOSIS — F321 Major depressive disorder, single episode, moderate: Secondary | ICD-10-CM
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
## 2015-10-02 ENCOUNTER — Ambulatory Visit (INDEPENDENT_AMBULATORY_CARE_PROVIDER_SITE_OTHER): Payer: Self-pay | Admitting: Internal Medicine

## 2015-10-02 ENCOUNTER — Encounter: Payer: Self-pay | Admitting: Internal Medicine

## 2015-10-02 VITALS — BP 108/68 | HR 70 | Ht <= 58 in | Wt 144.0 lb

## 2015-10-02 DIAGNOSIS — N3090 Cystitis, unspecified without hematuria: Secondary | ICD-10-CM

## 2015-10-02 DIAGNOSIS — F329 Major depressive disorder, single episode, unspecified: Secondary | ICD-10-CM

## 2015-10-02 DIAGNOSIS — N912 Amenorrhea, unspecified: Secondary | ICD-10-CM

## 2015-10-02 DIAGNOSIS — F32A Depression, unspecified: Secondary | ICD-10-CM

## 2015-10-02 LAB — POCT URINALYSIS DIPSTICK
Bilirubin, UA: NEGATIVE
Blood, UA: 1
Glucose, UA: NEGATIVE
Ketones, UA: NEGATIVE
NITRITE UA: NEGATIVE
PH UA: 6.5
PROTEIN UA: NEGATIVE
Spec Grav, UA: 1.015
UROBILINOGEN UA: 0.2

## 2015-10-02 LAB — POCT URINE PREGNANCY: PREG TEST UR: NEGATIVE

## 2015-10-02 MED ORDER — SULFAMETHOXAZOLE-TRIMETHOPRIM 800-160 MG PO TABS: 1.0000 | ORAL_TABLET | Freq: Two times a day (BID) | ORAL | Status: DC

## 2015-10-02 MED ORDER — CITALOPRAM HYDROBROMIDE 20 MG PO TABS: ORAL_TABLET | ORAL | Status: AC

## 2015-10-02 NOTE — Patient Instructions (Signed)
Tome mucho agua todos los dias Habla clinica si no se siente mejor en 2 dias

## 2015-10-02 NOTE — Progress Notes (Signed)
Subjective:    Patient ID: [name], female    DOB: [birth year], 50 y.o.   MRN: [id number]  HPI   History difficult to pin down   1.  Depression:  Still with boyfriend.  States they are not fighting as much.  She states he treats her better.  She is not drinking alcohol very much now.  Now having what appears to be a shot of Tequila maybe once a week.   Still finds herself tearful at times, though crying less.  Last was 3 days ago.  Not as sad.   No suicidal ideation. Has had difficulty initiating sleep in past week.  Cannot say what she feels is keeping her awake.   Not falling asleep until 1 to 2 a.m. Later, after seeing she does not feel safe around her boyfriend, patient denies to me.  Unable to get her to better clarify, but she is insistent she is safe with her boyfriend.   Sees Natosha next Tuesday.  2.  Amenorrhea since ?end of November.  Has had nausea in the morning at times.  States her periods were heavy and then just stopped after November.  Breasts sore all the time.  Having episodes of feeling hot, then cold.  No definite fever. No sweats. GI symptoms for the past week:   Feels like something in her abdomen in periumbilical area is "jumping".  Also with pain in the same area of abdomen.  Describes a pain like pressure in her abdomen.  Does have a lot of gas with the pain.  Passing gas in both directions.  Some improvement in pain with passing of gas.    No diarrhea or constipation.  Stools are regular and soft.  No melena or hematochezia.   States she still has a good appetite, but later states the smell of food can make her nauseated.  States she is eating differently--coffee, tortillas, Coke.  States this food fills her up better.   Sounds like this is in addition to usual three meals per day.  Portions are decreased, however.   Feels she has lost weight.  States she lost more weight than what shows today--since increasing tortillas consumption, has picked up weight  again. Maybe a little burning on urination, but that just started today. Having urinary frequency as well. Feels lonely in her home--would like to have a little daughter to keep her company.  Does not feel her situation lends itself to difficulties raising a child at this time.      Review of Systems     Objective:   Physical Exam Does not appear ill NAD HEENT:  Throat without injection.  MMM. Neck:  Supple, no adenopathy Chest:  CTA CV:  RRR without murmur or rub.  Radial pulses normal and equal. Abd:  S, mild tenderness in bilateral lower quadrants, more significantly in suprapubic area.  No rebound or peritoneal signs.  When divert attention, tenderness seems to resolve with continued exam.  Possibly some mild bilateral flank tenderness.        Assessment & Plan:  1.  Abdominal pain:  UA with +leukocytes and trace blood. Urine pregnancy negative.  Send urine for culture.  Start Trim/Sulfa DS twice daily for 3 days with most likely diagnosis of Cystitis.    2.  Depression:  PHQ9 actually good today.  Pt. Does not feel she needs increase in her Citalopram.  Refilled for a year.  Will discuss with her counselor, Nilda Simmer. Concerned about  her significant change in history regarding boyfriend and safety issues.   Treat depression for at least 1 years--refills for Citalopram to do so sent.

## 2015-10-04 LAB — URINE CULTURE

## 2015-10-08 ENCOUNTER — Ambulatory Visit (INDEPENDENT_AMBULATORY_CARE_PROVIDER_SITE_OTHER): Payer: Self-pay | Admitting: Licensed Clinical Social Worker

## 2015-10-08 DIAGNOSIS — F321 Major depressive disorder, single episode, moderate: Secondary | ICD-10-CM

## 2015-10-08 NOTE — Progress Notes (Signed)
   THERAPY PROGRESS NOTE  Session Time: 51mn  Participation Level: Active  Behavioral Response: Casual and Well GroomedAlertEuthymic  Type of Therapy: Individual Therapy  Treatment Goals addressed: Coping  Interventions: Supportive  Summary: RVAN EHLERTis a 50y.o. female who presents with a positive mood and flat affect. She reported that things are going well for her. She shared that her job is good and that her relationship with her boyfriend is good. Romesha shared that her main stressor currently is that she is not getting enough sleep at night because when she lays down, her "brain is still awake." Dierdre and LCSW reviewed her sleep habits to identify solutions to her sleeping problems. Nimah said that she would try to get more exercise during the day and that she would stop looking at her cellphone screen at least one hour prior to her bedtime. She reported that she is taking her antidepressant daily and that it is effective. Lylian shared that she is still with her boyfriend and feels that things are going well with him. She reported that her pregnancy test came back negative and "that's fine." She asserted that she did not want to have a baby now that her sons are grown. Zelphia and LCSW reviewed her treatment goals and processed about her needs current. Matilynn reported that she feels fine and has met her goals for counseling. She reported that she does not feel that she needs ongoing counseling but that she will return in the future if needed.  Suicidal/Homicidal: Nowithout intent/plan  Therapist Response: LCSW used supportive counseling techniques throughout the session to validate Jessy's feelings. LCSW helped Jaquisha to identify a few sleep hygiene habits that she can adjust to get better sleep. LCSW inquired about Valyncia's progress towards her goals of coping with depressive symptoms and increasing her self-esteem; she reported that she has met those goals. LCSW and  Bertrice agreed that she will stop counseling for now but that she can return in the future if needed.  Plan: Return again in 0 weeks.  Diagnosis: Axis I: Major Depression, single episode    Axis II: No diagnosis    NMetta Clines LCSW 10/08/2015

## 2015-11-05 ENCOUNTER — Ambulatory Visit (INDEPENDENT_AMBULATORY_CARE_PROVIDER_SITE_OTHER): Payer: Self-pay | Admitting: Internal Medicine

## 2015-11-05 ENCOUNTER — Encounter: Payer: Self-pay | Admitting: Internal Medicine

## 2015-11-05 VITALS — BP 110/70 | HR 72 | Temp 98.3°F | Ht <= 58 in | Wt 141.0 lb

## 2015-11-05 DIAGNOSIS — J0140 Acute pansinusitis, unspecified: Secondary | ICD-10-CM

## 2015-11-05 MED ORDER — AMOXICILLIN 500 MG PO CAPS: 500.0000 mg | ORAL_CAPSULE | Freq: Three times a day (TID) | ORAL | Status: DC

## 2015-11-05 NOTE — Progress Notes (Signed)
   Subjective:    Patient ID: [name], female    DOB: [birth year], 50 y.o.   MRN: [id number]  HPI  Has been ill for 2 weeks with congestion and cough.  Cough is productive of white sputum.  Had a fever previously, but that has resolved.  Never took her temp.   No dyspnea.   Cough bothers her most in the morning and afternoon.   Has taken tea and Theraflu.  Helps the itching in her throat.   Has itchy watery eyes, nasal congestion with itching, sneezing.  States her bones hurt all over her body.  Has not been to see Kathryne Hitchatosha recently.  States she feels better.  Is learning how to read through [employer].  Current outpatient prescriptions:  .  acetaminophen (TYLENOL) 500 MG tablet, Take 1,000 mg by mouth every 6 (six) hours as needed. Reported on 10/02/2015, Disp: , Rfl:  .  calcium gluconate 500 MG tablet, Take 1 tablet by mouth 3 (three) times daily., Disp: , Rfl:  .  citalopram (CELEXA) 20 MG tablet, 1 tab by mouth daily, Disp: 30 tablet, Rfl: 3 .  ferrous sulfate 325 (65 FE) MG EC tablet, Take 325 mg by mouth daily with breakfast., Disp: , Rfl:  .  Multiple Vitamin (MULTIVITAMIN) tablet, Take 1 tablet by mouth daily. Reported on 11/05/2015, Disp: , Rfl:  .  naproxen sodium (ANAPROX) 220 MG tablet, Take 440 mg by mouth 2 (two) times daily as needed (for pain). Reported on 11/05/2015, Disp: , Rfl:   Not taking Naproxen actually  Allergies:  NKDA   Review of Systems     Objective:   Physical Exam   NAD HEENT:  PERRL, EOMI, TMs pearly gray, throat with mild posterior pharyngeal cobbling, Nasal mucosa mildly swollen and red, tender over frontal and maxillary sinuses Neck:  Supple, no adenopathy Chest:  CTA CV:  RRR without murmur or rub ABd:  S, +BS      Assessment & Plan:  Acute Sinusitis vs. Simply seasonal allergies. Amoxicillin 500 mg three times daily for 10 days for acute sinusitis Fexofenadine 180 mg once daily for possible allergies.

## 2015-11-05 NOTE — Patient Instructions (Signed)
Fexofenadine (Allegra) 180 mg una vez al dia para tos

## 2016-03-03 ENCOUNTER — Ambulatory Visit: Payer: Self-pay | Admitting: Internal Medicine

## 2017-10-17 ENCOUNTER — Other Ambulatory Visit: Payer: Self-pay

## 2017-10-17 DIAGNOSIS — N3001 Acute cystitis with hematuria: Secondary | ICD-10-CM

## 2017-10-17 LAB — POCT URINALYSIS DIPSTICK
Bilirubin, UA: NEGATIVE
GLUCOSE UA: NEGATIVE
KETONES UA: NEGATIVE
NITRITE UA: NEGATIVE
Spec Grav, UA: <=1.005 — AB (ref 1.010–1.025)
Urobilinogen, UA: 0.2 E.U./dL
pH, UA: 5 (ref 5.0–8.0)

## 2017-10-19 LAB — URINE CULTURE

## 2017-10-23 ENCOUNTER — Ambulatory Visit: Payer: Self-pay | Admitting: Internal Medicine

## 2019-02-18 ENCOUNTER — Ambulatory Visit (HOSPITAL_COMMUNITY)
Admission: EM | Admit: 2019-02-18 | Discharge: 2019-02-18 | Disposition: A | Discharge status: Home / Self Care | Payer: Self-pay | Attending: Family Medicine | Admitting: Family Medicine

## 2019-02-18 ENCOUNTER — Encounter (HOSPITAL_COMMUNITY): Payer: Self-pay | Admitting: *Deleted

## 2019-02-18 ENCOUNTER — Other Ambulatory Visit: Payer: Self-pay

## 2019-02-18 DIAGNOSIS — J029 Acute pharyngitis, unspecified: Secondary | ICD-10-CM | POA: Insufficient documentation

## 2019-02-18 LAB — POCT RAPID STREP A: Streptococcus, Group A Screen (Direct): NEGATIVE

## 2019-02-18 MED ORDER — LIDOCAINE VISCOUS HCL 2 % MT SOLN: 15.0000 mL | OROMUCOSAL | 0 refills | Status: DC | PRN

## 2019-02-18 NOTE — Discharge Instructions (Signed)
Rapid strep test negative for infection. Sending in some medication to help with discomfort in the throat Make sure you are using precautions and staying quarantined until you get your COVID results. Follow up as needed for continued or worsening symptoms

## 2019-02-18 NOTE — ED Triage Notes (Addendum)
Per Spanish interpreter:  C/O "flu-like symptoms" x past week wk without fever, which resolved over past 2 days..  Now C/O swollen lips and noticing blood in her mouth x2 days when she wakes in the morning; states she spits blood.  Denies any hemoptysis, though c/o slight productive cough. Pt states she had Covid test performed 2 days ago at [facility] & is awaiting results; no record of pending lab seen in chart.

## 2019-02-18 NOTE — ED Provider Notes (Signed)
[facility]    CSN: [id number] Arrival date & time: 02/18/19  1131     History   Chief Complaint Chief Complaint  Patient presents with  . Cough    HPI [name] is a 53 y.o. female.   Patient is a 53 year old female with past medical history of anemia that presents with flulike symptoms over the past week without fever.  Most of the symptoms have resolved since but she is still having a sore throat and mild cough.  Reports that she is coughing up mucus and spitting up blood-tinged mucus.  [facility] and has not yet gotten results.  Today's biggest complaint from her is sore throat.  She has been taking TheraFlu for symptoms with some relief.  No known COVID exposures or sick contacts.  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  ROS per HPI      Past Medical History:  Diagnosis Date  . Anemia 2014   Due to heavy periods  . Blood transfusion without reported diagnosis 2014   Anemia due to heavy periods    Patient Active Problem List   Diagnosis Date Noted  . Depression 06/18/2015  . Microcytic anemia 08/11/2012  . Menometrorrhagia 08/11/2012  . Pyelonephritis 08/11/2012  . Lower abdominal pain 08/10/2012  . Anemia 08/10/2012    Past Surgical History:  Procedure Laterality Date  . CESAREAN SECTION  1996  . CESAREAN SECTION  1993  . CESAREAN SECTION  1990  . CHOLECYSTECTOMY    . TUBAL LIGATION  1995    OB History    Gravida  4   Para  3   Term  3   Preterm      AB  1   Living  3     SAB  1   TAB      Ectopic      Multiple      Live Births               Home Medications    Prior to Admission medications   Medication Sig Start Date End Date Taking? Authorizing Provider  calcium gluconate 500 MG tablet Take 1 tablet by mouth 3 (three) times daily.    [provider]  citalopram (CELEXA) 20 MG tablet 1 tab by mouth daily  10/02/15   Julieanne MansonMulberry, Elizabeth, MD  ferrous sulfate 325 (65 FE) MG EC tablet Take 325 mg by mouth daily with breakfast. 10/18/12   Marny LowensteinWenzel, Julie N, PA-C  lidocaine (XYLOCAINE) 2 % solution Use as directed 15 mLs in the mouth or throat as needed for mouth pain. 02/18/19   Dahlia ByesBast, Ginger Leeth A, NP  Multiple Vitamin (MULTIVITAMIN) tablet Take 1 tablet by mouth daily. Reported on 11/05/2015    [provider]    Family History Family History  Problem Relation Age of Onset  . Other Mother   . Other Father        Gastritis  . Asthma Son   . Asthma Son   . Alcohol abuse Paternal Uncle     Social History Social History   Tobacco Use  . Smoking status: Never Smoker  . Smokeless tobacco: Never Used  Substance Use Topics  . Alcohol use: Not Currently  . Drug use: No     Allergies   Patient has no known allergies.   Review of Systems Review of Systems   Physical Exam  Triage Vital Signs ED Triage Vitals [02/18/19 1155]  Enc Vitals Group     BP 117/62     Pulse Rate (!) 55     Resp 14     Temp (!) 97.2 F (36.2 C)     Temp Source Temporal     SpO2 97 %     Weight      Height      Head Circumference      Peak Flow      Pain Score      Pain Loc      Pain Edu?      Excl. in GC?    No data found.  Updated Vital Signs BP 117/62   Pulse (!) 55   Temp (!) 97.2 F (36.2 C) (Temporal)   Resp 14   LMP 07/27/2015 (Approximate)   SpO2 97%   Visual Acuity Right Eye Distance:   Left Eye Distance:   Bilateral Distance:    Right Eye Near:   Left Eye Near:    Bilateral Near:     Physical Exam Vitals signs and nursing note reviewed.  Constitutional:      General: She is not in acute distress.    Appearance: Normal appearance. She is not ill-appearing, toxic-appearing or diaphoretic.  HENT:     Head: Normocephalic and atraumatic.     Nose: Nose normal.     Mouth/Throat:     Pharynx: Oropharynx is clear. Posterior oropharyngeal erythema present.  Eyes:      Conjunctiva/sclera: Conjunctivae normal.  Neck:     Musculoskeletal: Normal range of motion.  Cardiovascular:     Rate and Rhythm: Normal rate and regular rhythm.     Pulses: Normal pulses.     Heart sounds: Normal heart sounds.  Pulmonary:     Effort: Pulmonary effort is normal.     Breath sounds: Normal breath sounds.  Musculoskeletal: Normal range of motion.  Skin:    General: Skin is warm and dry.  Neurological:     Mental Status: She is alert.  Psychiatric:        Mood and Affect: Mood normal.      UC Treatments / Results  Labs (all labs ordered are listed, but only abnormal results are displayed) Labs Reviewed  CULTURE, GROUP A STREP [facility]  POCT RAPID STREP A    EKG None  Radiology No results found.  Procedures Procedures (including critical care time)  Medications Ordered in UC Medications - No data to display  Initial Impression / Assessment and Plan / UC Course  I have reviewed the triage vital signs and the nursing notes.  Pertinent labs & imaging results that were available during my care of the patient were reviewed by me and considered in my medical decision making (see chart for details).     Rapid strep test negative.  Patient has already been COVID tested.  This is most likely some sort of viral illness. Lidocaine for sore throat She can continue the over-the-counter medicine for symptoms Instructed to use precaution and stay home until COVID results are received. Follow up as needed for continued or worsening symptoms  Final Clinical Impressions(s) / UC Diagnoses   Final diagnoses:  Sore throat     Discharge Instructions     Rapid strep test negative for infection. Sending in some medication to help with discomfort in the throat Make sure you are using precautions and staying quarantined until you get your COVID results. Follow up as needed  for continued or worsening symptoms     ED Prescriptions    Medication Sig Dispense  Auth. Provider   lidocaine (XYLOCAINE) 2 % solution Use as directed 15 mLs in the mouth or throat as needed for mouth pain. 100 mL Loura Halt A, NP     Controlled Substance Prescriptions [place] Controlled Substance Registry consulted? Not Applicable   Orvan July, NP 02/18/19 1332

## 2019-02-20 LAB — CULTURE, GROUP A STREP (THRC)

## 2019-07-19 ENCOUNTER — Encounter (HOSPITAL_COMMUNITY): Payer: Self-pay

## 2019-07-19 ENCOUNTER — Other Ambulatory Visit: Payer: Self-pay

## 2019-07-19 ENCOUNTER — Ambulatory Visit (HOSPITAL_COMMUNITY)
Admission: EM | Admit: 2019-07-19 | Discharge: 2019-07-19 | Disposition: A | Discharge status: Home / Self Care | Payer: Self-pay | Attending: Urgent Care | Admitting: Urgent Care

## 2019-07-19 DIAGNOSIS — N898 Other specified noninflammatory disorders of vagina: Secondary | ICD-10-CM

## 2019-07-19 DIAGNOSIS — N3001 Acute cystitis with hematuria: Secondary | ICD-10-CM

## 2019-07-19 DIAGNOSIS — N760 Acute vaginitis: Secondary | ICD-10-CM

## 2019-07-19 LAB — POCT URINALYSIS DIP (DEVICE)
Bilirubin Urine: NEGATIVE
Glucose, UA: NEGATIVE mg/dL
Ketones, ur: NEGATIVE mg/dL
Nitrite: NEGATIVE
Protein, ur: NEGATIVE mg/dL
Specific Gravity, Urine: 1.02 (ref 1.005–1.030)
Urobilinogen, UA: 0.2 mg/dL (ref 0.0–1.0)
pH: 7 (ref 5.0–8.0)

## 2019-07-19 MED ORDER — AZITHROMYCIN 250 MG PO TABS
ORAL_TABLET | ORAL | Status: AC
  Filled 2019-07-19: qty 4

## 2019-07-19 MED ORDER — AZITHROMYCIN 250 MG PO TABS
1000.0000 mg | ORAL_TABLET | Freq: Once | ORAL | Status: AC
  Administered 2019-07-19: 1000 mg via ORAL

## 2019-07-19 MED ORDER — CEFTRIAXONE SODIUM 250 MG IJ SOLR
INTRAMUSCULAR | Status: AC
  Filled 2019-07-19: qty 250

## 2019-07-19 MED ORDER — CEFTRIAXONE SODIUM 250 MG IJ SOLR
250.0000 mg | Freq: Once | INTRAMUSCULAR | Status: AC
  Administered 2019-07-19: 250 mg via INTRAMUSCULAR

## 2019-07-19 MED ORDER — FLUCONAZOLE 150 MG PO TABS: 150.0000 mg | ORAL_TABLET | ORAL | 0 refills | Status: DC

## 2019-07-19 MED ORDER — CEPHALEXIN 500 MG PO CAPS: 500.0000 mg | ORAL_CAPSULE | Freq: Two times a day (BID) | ORAL | 0 refills | Status: DC

## 2019-07-19 NOTE — ED Triage Notes (Signed)
Pt presents with generalized abdominal pain and vaginal burning & irritation X 3 days.

## 2019-07-19 NOTE — ED Provider Notes (Signed)
[place]   MRN: [id number] DOB: [birth year]  Subjective:   [name] is a 53 y.o. female presenting for 3-day history of vaginal burning and itching.  Patient states that she is sexually active with one female partner.  Generally patient uses condoms with patient as she does not see them every day.  However, unfortunately the last time they had sex she did not use condoms at his and since since.  Since then she developed the symptoms and she is very worried about having an STI.  Denies vaginal discharge, dysuria but does have lower pelvic pain, urinary frequency and urgency.  Hydrates with 1-2 bottles of water per day.  Denies history of diabetes, history of BV and yeast infections.  She did have a remote history of gonorrhea and chlamydia before coming to the [place].  No current facility-administered medications for this encounter.   Current Outpatient Medications:  .  calcium gluconate 500 MG tablet, Take 1 tablet by mouth 3 (three) times daily., Disp: , Rfl:  .  citalopram (CELEXA) 20 MG tablet, 1 tab by mouth daily, Disp: 30 tablet, Rfl: 3 .  ferrous sulfate 325 (65 FE) MG EC tablet, Take 325 mg by mouth daily with breakfast., Disp: , Rfl:  .  lidocaine (XYLOCAINE) 2 % solution, Use as directed 15 mLs in the mouth or throat as needed for mouth pain., Disp: 100 mL, Rfl: 0 .  Multiple Vitamin (MULTIVITAMIN) tablet, Take 1 tablet by mouth daily. Reported on 11/05/2015, Disp: , Rfl:    No Known Allergies  Past Medical History:  Diagnosis Date  . Anemia 2014   Due to heavy periods  . Blood transfusion without reported diagnosis 2014   Anemia due to heavy periods     Past Surgical History:  Procedure Laterality Date  . APPENDECTOMY    . CESAREAN SECTION  1996  . CESAREAN SECTION  1993  . CESAREAN SECTION  1990  . CHOLECYSTECTOMY    . TUBAL LIGATION  1995    Family History  Problem Relation Age of Onset  . Other Mother   . Other Father        Gastritis  . Asthma  Son   . Asthma Son   . Alcohol abuse Paternal Uncle     Social History   Tobacco Use  . Smoking status: Never Smoker  . Smokeless tobacco: Never Used  Substance Use Topics  . Alcohol use: Not Currently  . Drug use: No    Review of Systems  Constitutional: Negative for fever and malaise/fatigue.  HENT: Negative for congestion, ear pain, sinus pain and sore throat.   Eyes: Negative for discharge and redness.  Respiratory: Negative for cough, hemoptysis, shortness of breath and wheezing.   Cardiovascular: Negative for chest pain.  Gastrointestinal: Positive for abdominal pain. Negative for blood in stool, constipation, diarrhea, nausea and vomiting.  Genitourinary: Positive for frequency and urgency. Negative for dysuria, flank pain and hematuria.  Musculoskeletal: Negative for myalgias.  Skin: Negative for rash.  Neurological: Negative for dizziness, weakness and headaches.  Psychiatric/Behavioral: Negative for depression and substance abuse.     Objective:   Vitals: BP 112/65 (BP Location: Right Arm)   Pulse 81   Temp 98.2 F (36.8 C) (Oral)   Resp 17   LMP 07/27/2015 (Approximate)   SpO2 99%   Physical Exam Constitutional:      General: She is not in acute distress.    Appearance: Normal appearance. She is well-developed and normal  weight. She is not ill-appearing, toxic-appearing or diaphoretic.  HENT:     Head: Normocephalic and atraumatic.     Right Ear: External ear normal.     Left Ear: External ear normal.     Nose: Nose normal.     Mouth/Throat:     Mouth: Mucous membranes are moist.     Pharynx: Oropharynx is clear.  Eyes:     General: No scleral icterus.    Extraocular Movements: Extraocular movements intact.     Pupils: Pupils are equal, round, and reactive to light.  Cardiovascular:     Rate and Rhythm: Normal rate and regular rhythm.     Pulses: Normal pulses.     Heart sounds: Normal heart sounds. No murmur. No friction rub. No gallop.    Pulmonary:     Effort: Pulmonary effort is normal. No respiratory distress.     Breath sounds: Normal breath sounds. No stridor. No wheezing, rhonchi or rales.  Abdominal:     General: Bowel sounds are normal. There is no distension.     Palpations: Abdomen is soft. There is no mass.     Tenderness: There is generalized abdominal tenderness (Mild) and tenderness in the right lower quadrant, suprapubic area and left lower quadrant. There is no right CVA tenderness, left CVA tenderness, guarding or rebound.  Skin:    General: Skin is warm and dry.     Coloration: Skin is not pale.     Findings: No rash.  Neurological:     General: No focal deficit present.     Mental Status: She is alert and oriented to person, place, and time.  Psychiatric:        Mood and Affect: Mood normal.        Behavior: Behavior normal.        Thought Content: Thought content normal.        Judgment: Judgment normal.     Results for orders placed or performed during the hospital encounter of 07/19/19 (from the past 24 hour(s))  POCT urinalysis dip (device)     Status: Abnormal   Collection Time: 07/19/19  1:37 PM  Result Value Ref Range   Glucose, UA NEGATIVE NEGATIVE mg/dL   Bilirubin Urine NEGATIVE NEGATIVE   Ketones, ur NEGATIVE NEGATIVE mg/dL   Specific Gravity, Urine 1.020 1.005 - 1.030   Hgb urine dipstick SMALL (A) NEGATIVE   pH 7.0 5.0 - 8.0   Protein, ur NEGATIVE NEGATIVE mg/dL   Urobilinogen, UA 0.2 0.0 - 1.0 mg/dL   Nitrite NEGATIVE NEGATIVE   Leukocytes,Ua MODERATE (A) NEGATIVE     Assessment and Plan :   1. Acute vaginitis   2. Vaginal itching   3. Vaginal irritation   4. Acute cystitis with hematuria     Will cover patient for STI with IM ceftriaxone and azithromycin in clinic.  However, I emphasized to patient that I also suspect cystitis and yeast infection.  She was agreeable to covering for these with Keflex and Diflucan.  Labs pending. Counseled patient on potential for adverse  effects with medications prescribed/recommended today, ER and return-to-clinic precautions discussed, patient verbalized understanding.    Wallis Bamberg, PA-C 07/19/19 1354

## 2019-07-20 LAB — URINE CULTURE: Culture: <10000 — AB

## 2019-07-23 ENCOUNTER — Telehealth (HOSPITAL_COMMUNITY): Payer: Self-pay | Admitting: Emergency Medicine

## 2019-07-23 LAB — CERVICOVAGINAL ANCILLARY ONLY
Bacterial vaginitis: POSITIVE — AB
Candida vaginitis: NEGATIVE
Chlamydia: NEGATIVE
Neisseria Gonorrhea: NEGATIVE
Trichomonas: NEGATIVE

## 2019-07-23 MED ORDER — METRONIDAZOLE 500 MG PO TABS: 500.0000 mg | ORAL_TABLET | Freq: Two times a day (BID) | ORAL | 0 refills | Status: DC

## 2019-07-23 NOTE — Telephone Encounter (Signed)
Bacterial vaginosis is positive. This was not treated at the urgent care visit.  Flagyl 500 mg BID x 7 days #14 no refills sent to patients pharmacy of choice.    Attempted to reach patient. No answer at this time. Voicemail left.    

## 2019-07-24 ENCOUNTER — Telehealth (HOSPITAL_COMMUNITY): Payer: Self-pay | Admitting: Emergency Medicine

## 2019-07-24 MED ORDER — METRONIDAZOLE 500 MG PO TABS: 500.0000 mg | ORAL_TABLET | Freq: Two times a day (BID) | ORAL | 0 refills | Status: AC

## 2019-07-24 NOTE — Telephone Encounter (Signed)
Patient contacted and made aware of    results. Pt verbalized understanding and had all questions answered. With spanish interpreter. Sending medication to correct pharmacy.

## 2019-07-24 NOTE — Telephone Encounter (Signed)
Attempted to reach patient x2. No answer at this time. Voicemail left.    

## 2020-05-07 ENCOUNTER — Encounter (INDEPENDENT_AMBULATORY_CARE_PROVIDER_SITE_OTHER): Payer: Self-pay

## 2020-07-01 ENCOUNTER — Ambulatory Visit: Payer: Self-pay | Admitting: Family Medicine

## 2020-07-01 ENCOUNTER — Telehealth: Payer: Self-pay | Admitting: *Deleted

## 2020-07-01 NOTE — Telephone Encounter (Signed)
Copied from CRM [id number]. Topic: Appointment Scheduling - Scheduling Inquiry for Clinic >> Jul 01, 2020  9:09 AM Crist Infante wrote: Reason for CRM: pt's new pt had canceled today. Next available is 1/19.  Pt is hoping to get sooner appt.

## 2020-09-16 ENCOUNTER — Ambulatory Visit: Payer: Self-pay | Admitting: Family Medicine

## 2020-10-13 ENCOUNTER — Ambulatory Visit: Payer: Self-pay | Admitting: Family Medicine

## 2022-04-12 ENCOUNTER — Ambulatory Visit
Admission: RE | Admit: 2022-04-12 | Discharge: 2022-04-12 | Disposition: A | Discharge status: Home / Self Care | Payer: No Typology Code available for payment source | Source: Ambulatory Visit | Attending: Nurse Practitioner | Admitting: Nurse Practitioner

## 2022-04-12 ENCOUNTER — Other Ambulatory Visit: Payer: Self-pay

## 2022-04-12 DIAGNOSIS — R059 Cough, unspecified: Secondary | ICD-10-CM

## 2022-05-10 ENCOUNTER — Other Ambulatory Visit: Payer: Self-pay | Admitting: Nurse Practitioner

## 2022-05-10 ENCOUNTER — Ambulatory Visit
Admission: RE | Admit: 2022-05-10 | Discharge: 2022-05-10 | Disposition: A | Discharge status: Home / Self Care | Payer: Self-pay | Source: Ambulatory Visit | Attending: Nurse Practitioner | Admitting: Nurse Practitioner

## 2022-05-10 DIAGNOSIS — J40 Bronchitis, not specified as acute or chronic: Secondary | ICD-10-CM

## 2022-05-10 DIAGNOSIS — R059 Cough, unspecified: Secondary | ICD-10-CM

## 2023-03-29 ENCOUNTER — Ambulatory Visit: Payer: Self-pay | Admitting: Internal Medicine

## 2023-07-30 DIAGNOSIS — E7800 Pure hypercholesterolemia, unspecified: Secondary | ICD-10-CM | POA: Insufficient documentation

## 2023-07-30 DIAGNOSIS — R7303 Prediabetes: Secondary | ICD-10-CM

## 2023-07-30 DIAGNOSIS — D696 Thrombocytopenia, unspecified: Secondary | ICD-10-CM

## 2023-07-30 HISTORY — DX: Pure hypercholesterolemia, unspecified: E78.00

## 2023-07-30 HISTORY — DX: Prediabetes: R73.03

## 2023-07-30 HISTORY — DX: Thrombocytopenia, unspecified: D69.6

## 2023-08-09 ENCOUNTER — Ambulatory Visit: Payer: Self-pay | Admitting: Internal Medicine

## 2023-08-09 ENCOUNTER — Encounter: Payer: Self-pay | Admitting: Internal Medicine

## 2023-08-09 VITALS — BP 136/80 | Resp 16 | Ht <= 58 in | Wt 175.0 lb

## 2023-08-09 DIAGNOSIS — Z6841 Body Mass Index (BMI) 40.0 and over, adult: Secondary | ICD-10-CM

## 2023-08-09 DIAGNOSIS — F4329 Adjustment disorder with other symptoms: Secondary | ICD-10-CM | POA: Insufficient documentation

## 2023-08-09 DIAGNOSIS — R200 Anesthesia of skin: Secondary | ICD-10-CM | POA: Insufficient documentation

## 2023-08-09 DIAGNOSIS — Z550 Illiteracy and low-level literacy: Secondary | ICD-10-CM | POA: Insufficient documentation

## 2023-08-09 DIAGNOSIS — R002 Palpitations: Secondary | ICD-10-CM | POA: Insufficient documentation

## 2023-08-09 DIAGNOSIS — E66813 Obesity, class 3: Secondary | ICD-10-CM | POA: Insufficient documentation

## 2023-08-09 NOTE — Progress Notes (Addendum)
Subjective:    Patient ID: [name], female   DOB: [birth year], 57 y.o.   MRN: [id number]   HPI  Previous patient, last seen in 2017 Here to re establish  [name] interprets   Bilateral hands fall asleep:  Notes this especially when sleeping.  She feels the ulnar side of hand goes to sleep first and then entire hand, radiating up to arm.  Problem for about 3 months.  Cannot recall new use of hands or leaning on her elbows. Has never worn any splints for her wrists.  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    2.  Palpitations:  for past 3 weeks.  States had in past, perhaps 2 months ago, but went away.  Started up again 3 weeks ago and does not seem to be resolving.  Flashes her hand opened and closed to describe.  Sounds like she feels her HR is a bit fast.   Can occur at any time--if having at bedtime, difficulty getting to sleep, but does not awaken her from sleep. Can last up to 3 hours. No associated fatigue or light headedness.   No associated chest pain or dyspnea.   She drinks water when she develops the palpitations and seems to decrease.   She continues to do whatever she was doing when started.    3.  Elevated BP:  decreased to normal range on recheck today.  No history of hypertension.  She has gained weight in past few years.    4.  Obesity:  has gained 31 lbs in past 7 years.  She states she has not been eating as healthy as she did in past.  Admits to eating large portions.  Lives alone now.  Adult children have moved out in recent years.  Denies eating out of boredom or loneliness.    5.  Stress:  mother in [place], whom she has not seen in some time, has been diagnosed with cancer.  Does use eating as a comfort measure.  Current Meds  Medication Sig   ASPIRIN ADULT PO Take by mouth as needed.   B Complex-C (B-COMPLEX WITH VITAMIN C) tablet Take 1 tablet by mouth daily.    Multiple Vitamin (MULTIVITAMIN) tablet Take 1 tablet by mouth daily. Reported on 11/05/2015   Omega-3 Fatty Acids (OMEGA-3 FISH OIL PO) Take by mouth daily.   No Known Allergies   Review of Systems    Objective:   BP 136/80 (BP Location: Right Arm, Patient Position: Sitting, Cuff Size: Normal)   Resp 16   Ht 4' 7.25" (1.403 m)   Wt 175 lb (79.4 kg)   LMP 07/27/2015 (Approximate)   BMI 40.31 kg/m   Physical Exam Constitutional:      Appearance: She is obese.  HENT:     Head: Normocephalic and atraumatic.     Right Ear: Tympanic membrane, ear canal and external ear normal.     Left Ear: Tympanic membrane, ear canal and external ear normal.     Nose: Nose normal.     Mouth/Throat:     Mouth: Mucous membranes are moist.     Pharynx: Oropharynx is clear.  Eyes:     Extraocular Movements: Extraocular movements intact.     Conjunctiva/sclera: Conjunctivae normal.     Pupils: Pupils are equal, round, and reactive to light.  Neck:     Thyroid: No  thyroid mass or thyromegaly.  Cardiovascular:     Rate and Rhythm: Normal rate and regular rhythm.     Heart sounds: S1 normal and S2 normal. No murmur heard.    No friction rub. No S3 or S4 sounds.     Comments: No carotid bruits.  Carotid, radial, DP and PT pulses normal and equal.   Pulmonary:     Effort: Pulmonary effort is normal.     Breath sounds: Normal breath sounds.  Abdominal:     General: Abdomen is protuberant. Bowel sounds are normal.     Palpations: Abdomen is soft. There is no hepatomegaly, splenomegaly or mass.     Tenderness: There is no abdominal tenderness.     Hernia: No hernia is present.     Comments: Significant abdominal obesity, almost to point where patient cannot bring her dorsal hands together in front to perform testing.    Musculoskeletal:        General: Normal range of motion.     Cervical back: Normal range of motion and neck supple.     Right lower leg: No edema.     Left lower leg: No edema.   Skin:    General: Skin is warm.     Capillary Refill: Capillary refill takes less than 2 seconds.     Findings: No rash.  Neurological:     Comments: Negative Tinels at volar wrists and medial elbow.  Possibly mildly positive Phalens--only middle left finger with symptoms of numbness.   Good grip      Assessment & Plan   HM:  refusing vaccines today as her friend had an allergic rxn to influenza vaccine.  2.  Hand and forearm numbness:  suspect Carpal Tunnel Syndrome, but not clear from exam.  May also have medial elbow compression of ulnar nerve when sleeping.  Will recommend weight loss and cock up splints for sleep to see if any benefit.  Consider elbow pads as well.    3.  Elevated BP:  lifestyle changes/weight loss.  4.  Morbid obesity: encouraged decrease in portion size and getting back to healthier diet as well as physical activity.  FLP, TSH, A1C today.    5.  Palpitations:  ECG with NSR and otherwise normal today.  CBC, CMP, TSH.    Son, [name], can read dietary info to her.  She cannot read.

## 2023-08-09 NOTE — Patient Instructions (Signed)
Tome un vaso de agua antes de cada comida Tome un minimo de 6 a 8 vasos de agua diarios Coma tres veces al dia Coma una proteina y una grasa saludable con comida.  (huevos, pescado, pollo, pavo, y limite carnes rojas Coma 5 porciones diarias de legumbres.  Mezcle los colores Coma 2 porciones diarias de frutas con cascara cuando sea comestible Use platos pequeos Suelte su tenedor o cuchara despues de cada mordida hata que se mastique y se trague Come en la mesa con amigos o familiares por lo menos una vez al dia Apague la televisin y aparatos electrnicos durante la comida  Su objetivo debe ser perder una libra por semana  Estudios recientes indican que las personas quienes consumen todos de sus calorias durante 12 horas se bajan de pesocon Mas eficiencia.  Por ejemplo, si Usted come su primera comida a las 7:00 a.m., su comida final del dia se debe completar antes de las 7:00 p.m. 

## 2023-08-09 NOTE — Progress Notes (Signed)
SDOH Screenings   Food Insecurity: Food Insecurity Present (08/09/2023)  Housing: Medium Risk (08/09/2023)  Transportation Needs: No Transportation Needs (08/09/2023)  Utilities: Not At Risk (08/09/2023)  Depression (PHQ2-9): Medium Risk (08/09/2023)  Financial Resource Strain: Not on File (12/22/2021)   Received from [place], [place]  Physical Activity: Not on File (12/22/2021)   Received from [place], [place]  Social Connections: Not on File (12/22/2021)   Received from [place], [place]  Stress: Not on File (12/22/2021)   Received from [place], [place]  Tobacco Use: Low Risk  (08/09/2023)         08/09/2023   11:15 AM  GAD 7 : Generalized Anxiety Score  Nervous, Anxious, on Edge 0  Control/stop worrying 0  Worry too much - different things 3  Trouble relaxing 0  Restless 0  Easily annoyed or irritable 1  Afraid - awful might happen 3  Total GAD 7 Score 7  Anxiety Difficulty Somewhat difficult     Flowsheet Row Office Visit from 08/09/2023 in [facility]  PHQ-9 Total Score 6      Client needs helps with food access. LJ calling to invite her to the mobile market tomorrow and will give other food resources at that point. Client was tearful about mother and was informed that we do counseling. Dr. Delrae Alfred was informed.

## 2023-08-12 LAB — COMPREHENSIVE METABOLIC PANEL
ALT: 87 [IU]/L — ABNORMAL HIGH (ref 0–32)
AST: 53 [IU]/L — ABNORMAL HIGH (ref 0–40)
Albumin: 4.3 g/dL (ref 3.8–4.9)
Alkaline Phosphatase: 230 [IU]/L — ABNORMAL HIGH (ref 44–121)
BUN/Creatinine Ratio: 35 — ABNORMAL HIGH (ref 9–23)
BUN: 17 mg/dL (ref 6–24)
Bilirubin Total: 0.3 mg/dL (ref 0.0–1.2)
CO2: 22 mmol/L (ref 20–29)
Calcium: 9.4 mg/dL (ref 8.7–10.2)
Chloride: 103 mmol/L (ref 96–106)
Creatinine, Ser: 0.48 mg/dL — ABNORMAL LOW (ref 0.57–1.00)
Globulin, Total: 2.9 g/dL (ref 1.5–4.5)
Glucose: 100 mg/dL — ABNORMAL HIGH (ref 70–99)
Potassium: 3.9 mmol/L (ref 3.5–5.2)
Sodium: 140 mmol/L (ref 134–144)
Total Protein: 7.2 g/dL (ref 6.0–8.5)
eGFR: 112 mL/min/{1.73_m2} (ref >59)

## 2023-08-12 LAB — CBC WITH DIFFERENTIAL/PLATELET
Basophils Absolute: 0.1 10*3/uL (ref 0.0–0.2)
Basos: 1 %
EOS (ABSOLUTE): 0.5 10*3/uL — ABNORMAL HIGH (ref 0.0–0.4)
Eos: 5 %
Hematocrit: 40.7 % (ref 34.0–46.6)
Hemoglobin: 12.9 g/dL (ref 11.1–15.9)
Immature Grans (Abs): 0 10*3/uL (ref 0.0–0.1)
Immature Granulocytes: 0 %
Lymphocytes Absolute: 4 10*3/uL — ABNORMAL HIGH (ref 0.7–3.1)
Lymphs: 40 %
MCH: 28 pg (ref 26.6–33.0)
MCHC: 31.7 g/dL (ref 31.5–35.7)
MCV: 88 fL (ref 79–97)
Monocytes Absolute: 0.9 10*3/uL (ref 0.1–0.9)
Monocytes: 9 %
Neutrophils Absolute: 4.5 10*3/uL (ref 1.4–7.0)
Neutrophils: 45 %
Platelets: 125 10*3/uL — ABNORMAL LOW (ref 150–450)
RBC: 4.61 x10E6/uL (ref 3.77–5.28)
RDW: 13.3 % (ref 11.7–15.4)
WBC: 9.9 10*3/uL (ref 3.4–10.8)

## 2023-08-12 LAB — LIPID PANEL W/O CHOL/HDL RATIO
Cholesterol, Total: 200 mg/dL — ABNORMAL HIGH (ref 100–199)
HDL: 56 mg/dL (ref >39)
LDL Chol Calc (NIH): 125 mg/dL — ABNORMAL HIGH (ref 0–99)
Triglycerides: 107 mg/dL (ref 0–149)
VLDL Cholesterol Cal: 19 mg/dL (ref 5–40)

## 2023-08-12 LAB — HGB A1C W/O EAG: Hgb A1c MFr Bld: 6 % — ABNORMAL HIGH (ref 4.8–5.6)

## 2023-08-12 LAB — TSH: TSH: 2.17 u[IU]/mL (ref 0.450–4.500)

## 2023-09-07 ENCOUNTER — Other Ambulatory Visit: Payer: Self-pay | Admitting: Psychology

## 2023-09-26 ENCOUNTER — Other Ambulatory Visit: Payer: Self-pay | Admitting: Psychology

## 2023-09-28 ENCOUNTER — Encounter (HOSPITAL_COMMUNITY): Payer: Self-pay | Admitting: Emergency Medicine

## 2023-09-28 ENCOUNTER — Emergency Department (HOSPITAL_COMMUNITY)
Admission: EM | Admit: 2023-09-28 | Discharge: 2023-09-28 | Disposition: A | Discharge status: Home / Self Care | Payer: Self-pay | Attending: Emergency Medicine | Admitting: Emergency Medicine

## 2023-09-28 ENCOUNTER — Other Ambulatory Visit: Payer: Self-pay

## 2023-09-28 ENCOUNTER — Emergency Department (HOSPITAL_COMMUNITY): Payer: Self-pay

## 2023-09-28 DIAGNOSIS — J101 Influenza due to other identified influenza virus with other respiratory manifestations: Secondary | ICD-10-CM | POA: Insufficient documentation

## 2023-09-28 DIAGNOSIS — J45909 Unspecified asthma, uncomplicated: Secondary | ICD-10-CM | POA: Insufficient documentation

## 2023-09-28 DIAGNOSIS — J189 Pneumonia, unspecified organism: Secondary | ICD-10-CM | POA: Insufficient documentation

## 2023-09-28 DIAGNOSIS — Z20822 Contact with and (suspected) exposure to covid-19: Secondary | ICD-10-CM | POA: Insufficient documentation

## 2023-09-28 DIAGNOSIS — D696 Thrombocytopenia, unspecified: Secondary | ICD-10-CM | POA: Insufficient documentation

## 2023-09-28 LAB — COMPREHENSIVE METABOLIC PANEL
ALT: 81 U/L — ABNORMAL HIGH (ref 0–44)
AST: 58 U/L — ABNORMAL HIGH (ref 15–41)
Albumin: 3.9 g/dL (ref 3.5–5.0)
Alkaline Phosphatase: 152 U/L — ABNORMAL HIGH (ref 38–126)
Anion gap: 12 (ref 5–15)
BUN: 6 mg/dL (ref 6–20)
CO2: 24 mmol/L (ref 22–32)
Calcium: 8.7 mg/dL — ABNORMAL LOW (ref 8.9–10.3)
Chloride: 100 mmol/L (ref 98–111)
Creatinine, Ser: 0.46 mg/dL (ref 0.44–1.00)
GFR, Estimated: >60 mL/min (ref >60)
Glucose, Bld: 123 mg/dL — ABNORMAL HIGH (ref 70–99)
Potassium: 3.6 mmol/L (ref 3.5–5.1)
Sodium: 136 mmol/L (ref 135–145)
Total Bilirubin: 0.7 mg/dL (ref 0.0–1.2)
Total Protein: 7.6 g/dL (ref 6.5–8.1)

## 2023-09-28 LAB — RESP PANEL BY RT-PCR (RSV, FLU A&B, COVID)  RVPGX2
Influenza A by PCR: POSITIVE — AB
Influenza B by PCR: NEGATIVE
Resp Syncytial Virus by PCR: NEGATIVE
SARS Coronavirus 2 by RT PCR: NEGATIVE

## 2023-09-28 LAB — CBC
HCT: 40.5 % (ref 36.0–46.0)
Hemoglobin: 13.4 g/dL (ref 12.0–15.0)
MCH: 28.1 pg (ref 26.0–34.0)
MCHC: 33.1 g/dL (ref 30.0–36.0)
MCV: 84.9 fL (ref 80.0–100.0)
Platelets: 71 10*3/uL — ABNORMAL LOW (ref 150–400)
RBC: 4.77 MIL/uL (ref 3.87–5.11)
RDW: 13.9 % (ref 11.5–15.5)
WBC: 7.3 10*3/uL (ref 4.0–10.5)
nRBC: 0 % (ref 0.0–0.2)

## 2023-09-28 LAB — LIPASE, BLOOD: Lipase: 25 U/L (ref 11–51)

## 2023-09-28 LAB — URINALYSIS, ROUTINE W REFLEX MICROSCOPIC
Bilirubin Urine: NEGATIVE
Glucose, UA: NEGATIVE mg/dL
Ketones, ur: NEGATIVE mg/dL
Leukocytes,Ua: NEGATIVE
Nitrite: NEGATIVE
Protein, ur: NEGATIVE mg/dL
Specific Gravity, Urine: 1.012 (ref 1.005–1.030)
pH: 8 (ref 5.0–8.0)

## 2023-09-28 LAB — GROUP A STREP BY PCR: Group A Strep by PCR: NOT DETECTED

## 2023-09-28 MED ORDER — AZITHROMYCIN 250 MG PO TABS: 250.0000 mg | ORAL_TABLET | Freq: Every day | ORAL | 0 refills | Status: DC

## 2023-09-28 MED ORDER — ONDANSETRON 4 MG PO TBDP: 4.0000 mg | ORAL_TABLET | Freq: Three times a day (TID) | ORAL | 0 refills | Status: DC | PRN

## 2023-09-28 MED ORDER — ACETAMINOPHEN 500 MG PO TABS
1000.0000 mg | ORAL_TABLET | Freq: Once | ORAL | Status: AC
  Administered 2023-09-28: 1000 mg via ORAL
  Filled 2023-09-28: qty 2

## 2023-09-28 MED ORDER — ONDANSETRON 4 MG PO TBDP
4.0000 mg | ORAL_TABLET | Freq: Once | ORAL | Status: AC
  Administered 2023-09-28: 4 mg via ORAL
  Filled 2023-09-28: qty 1

## 2023-09-28 MED ORDER — ONDANSETRON 4 MG PO TBDP: 4.0000 mg | ORAL_TABLET | Freq: Once | ORAL | Status: DC | PRN

## 2023-09-28 MED ORDER — ALBUTEROL SULFATE HFA 108 (90 BASE) MCG/ACT IN AERS: 1.0000 | INHALATION_SPRAY | Freq: Four times a day (QID) | RESPIRATORY_TRACT | 0 refills | Status: AC | PRN

## 2023-09-28 MED ORDER — ACETAMINOPHEN 325 MG PO TABS: 650.0000 mg | ORAL_TABLET | Freq: Once | ORAL | Status: DC | PRN

## 2023-09-28 MED ORDER — AZITHROMYCIN 250 MG PO TABS: ORAL_TABLET | ORAL | 0 refills | Status: AC

## 2023-09-28 NOTE — ED Triage Notes (Signed)
Spanish interpreter used in triage. Pt via POV c/o fever (100.2 in triage), headache, abdominal pain, and cold symptoms since Sunday with max temp at home 100F. No known sick contacts. Pt rates headache 10/10 and seems unwell.

## 2023-09-28 NOTE — ED Notes (Signed)
20/25 visual acuity bilaterally.

## 2023-09-28 NOTE — ED Provider Notes (Cosign Needed)
[place] EMERGENCY DEPARTMENT AT [facility] Provider Note  MDM   HPI/ROS:  [name] is a 58 y.o. female with pertinent past medical history of asthma who presents for evaluation of fever, headache, dyspnea, cough.  Patient reports the symptoms has been ongoing for the past 5 days with Tmax of "112F" at home.  She reports associated myalgias and nausea with mild generalized abdominal pain and 1 episode of nonbloody nonbilious emesis.  She denies any associated chest pain, dysuria, hematuria, diarrhea, constipation, leg swelling.  She ran out of her albuterol some time ago.  No known sick contacts.  She additionally reports a 2-3-week history of intermittent right sided blurry vision and tearing in her vision, and notes that when this happens she has more difficulty seeing objects that are far away but cannot see them close by.  She additionally notes over the last 3-4 weeks intermittent numbness in her hands without weakness.  She states all of the symptoms preceding her current symptoms and she is currently asymptomatic.  Reporting much improved  Physical exam is notable for: - Skin is warm to touch -- Mildly dry mucous membranes but normal capillary refill -- Trace crackles over the right lower lung base -- Normal neurologic exam  On my initial evaluation, patient is:  -Vital signs stable. Patient , hemodynamically stable, and non-toxic appearing. -Additional history obtained with assistance of Spanish interpreter [phone] -Labs reviewed: WBC 7.3, hemoglobin 13.4, creatinine 0.46, platelets 71 (previously 125 last month), AST 58/ ALT 81 (stable from prior) -Viral testing positive for influenza A -UA with rare bacteria, moderate blood -Imaging reviewed: Patchy right upper and left retrocardiac opacities  This patient's current presentation, including their history and physical exam, is most consistent with influenza A infection.  She does have isolated thrombocytopenia  however other cell lines within normal limits including notably no anemia.  She has mild transaminitis which is stable to slightly improved from prior.  UA with rare bacteria and moderate blood, however not overly suspicion for infection particularly as patient asymptomatic.  Did consider TTP given thrombocytopenia with headache, however this is overall felt much less likely based on history and exam.  Furthermore on reevaluation after receiving Tylenol and Zofran, patient reporting significant improvement in her headache and myalgias.  She does appear mildly dehydrated on exam, however able to rehydrate orally.  On further discussion with patient as above, appears her intermittent vision changes and hand tingling proceeded current infectious symptoms by some time and would have started around the same time that last blood work was drawn which only had mild thrombocytopenia of 125.  She has no tenderness over the temples that would concern for GCA, plus less likely given the subacute nature of her vision changes.  Currently with visual acuity 20/25 bilaterally and normal neurologic exam as above.  Suspect patient's acute symptoms can all be attributed to influenza A infection, with likely viral suppression causing acute thrombocytopenia.  Unclear exact etiology of patient's intermittent blurry vision, though low suspicion for retinal detachment, vitreous hemorrhage, CRAO, CRVO, optic neuritis at this time.  Patient was felt appropriate for discharge.  She is out of the window for Tamiflu given onset of symptoms 5 days ago.  Although no focal findings on auscultation of the lungs, will treat for pneumonia based on chest x-ray results as it is possible patient may have developed a postviral pneumonia.  Will additionally prescribe antiemetic.  Patient given prescription for her home albuterol inhaler, though no signs or symptoms  of asthma exacerbation on my assessment.  She was advised to follow-up closely with her  PCP including recheck of platelets within 1 week.  She was additionally given contact information for ophthalmologist to follow-up in the outpatient setting.  Patient was comfortable with this plan.  Strict return precautions were provided.  She remained stable and had no acute events while under my care in the emergency department.   Disposition:  I discussed the plan for discharge with the patient and/or their surrogate at bedside prior to discharge and they were in agreement with the plan and verbalized understanding of the return precautions provided. All questions answered to the best of my ability. Ultimately, the patient was discharged in stable condition with stable vital signs. I am reassured that they are capable of close follow up and good social support at home.   Clinical Impression:  1. Influenza A   2. Community acquired pneumonia, unspecified laterality   3. Thrombocytopenia (HCC)     Rx / DC Orders ED Discharge Orders          Ordered    albuterol (VENTOLIN HFA) 108 (90 Base) MCG/ACT inhaler  Every 6 hours PRN        09/28/23 2250    ondansetron (ZOFRAN-ODT) 4 MG disintegrating tablet  Every 8 hours PRN        09/28/23 2250    azithromycin (ZITHROMAX) 250 MG tablet  Daily,   Status:  Discontinued        09/28/23 2250    azithromycin (ZITHROMAX) 250 MG tablet  Daily        09/28/23 2314            The plan for this patient was discussed with Dr. Dalene Seltzer, who voiced agreement and who oversaw evaluation and treatment of this patient.   Clinical Complexity A medically appropriate history, review of systems, and physical exam was performed.  My independent interpretations of EKG, labs, and radiology are documented in the ED course above.   If decision rules were used in this patient's evaluation, they are listed below.   Patient's presentation is most consistent with acute presentation with potential threat to life or bodily function.  Medical Decision  Making Amount and/or Complexity of Data Reviewed Labs: ordered. Radiology: ordered.  Risk OTC drugs. Prescription drug management.    HPI/ROS      See MDM section for pertinent HPI and ROS. A complete ROS was performed with pertinent positives/negatives noted above.   Past Medical History:  Diagnosis Date   Anemia 08/29/2012   Due to heavy periods   Blood transfusion without reported diagnosis 08/29/2012   Anemia due to heavy periods   Obesity, morbid, BMI 40.0-49.9 (HCC)     Past Surgical History:  Procedure Laterality Date   APPENDECTOMY     CESAREAN SECTION  1996   CESAREAN SECTION  1993   CESAREAN SECTION  1990   CHOLECYSTECTOMY     TUBAL LIGATION  1995      Physical Exam   Vitals:   09/28/23 1222 09/28/23 1630 09/28/23 1632 09/28/23 1933  BP: 106/80 98/73  106/71  Pulse: (!) 107 99  98  Resp: 16 17  17   Temp: 100.2 F (37.9 C)  99.8 F (37.7 C) (!) 101.1 F (38.4 C)  TempSrc:   Oral Oral  SpO2: 92% 93%  94%  Weight:      Height:        Physical Exam Gen: NAD. Appears comfortable HENT: Conjunctiva clear,  PERRL, EOMI. mildly dry mucous membranes.  Visual acuity 20/25 OD, OS, OU CV: Tachycardic rate, regular rhythm. No M/R/G Pulm: Lungs CTAB with no wheezing, rales, or rhonchi.  GI: Abdomen soft, non-tender, non-distended. Normal bowel sounds in all 4 quadrants. MSK/Skin: No lower extremity edema. Extremities warm, well-perfused with 2+ pulses in all 4 extremities. Neuro: A&Ox3. GCS 15.  PERRL. EOMI. Visual fields grossly intact. Sensation intact to light touch across V1-V3. Full and symmetric facial movements. Hearing grossly intact. Uvula midline with symmetric palatal rise. Full strength of SCM and trapezius muscles bilaterally. Tongue protrudes midline with no fasciculations or atrophy. 5/5 strength in bilateral upper and lower extremities with sensation intact to light touch throughout. Normal finger-to-nose.     Procedures   If procedures were  preformed on this patient, they are listed below:  Procedures   Mikeal Hawthorne, MD Emergency Medicine PGY-2   Please note that this documentation was produced with the assistance of voice-to-text technology and may contain errors.    Mikeal Hawthorne, MD 09/29/23 [phone]

## 2023-09-28 NOTE — Discharge Instructions (Addendum)
You were seen in the emergency department for fever, headache, shortness of breath.  Test performed while you you are here include chest x-ray, viral testing, blood work, urine studies.  Based on your chest x-ray we are treating you for pneumonia.  Your blood work was overall reassuring, although your platelet level was low at 71.  This is likely due to viral suppression from you having the flu, however it is important that you see your PCP within the next week to have repeat blood work. Please take Tylenol and ibuprofen as needed for fever and bodyaches.  You can use the Zofran as needed for nausea and vomiting.  We are additionally sending in a refill of your albuterol inhaler. Please contact the ophthalmologist above to arrange for outpatient evaluation given your intermittent blurry vision. If you experience inability keep food or drink down, loss of consciousness, severe headache, new vision changes, or any other concerns, return to the ED for reevaluation.  Lo atendieron en el departamento de emergencias por fiebre, dolor de cabeza y dificultad para respirar.  Las [employer] est aqu incluyen radiografa de trax, pruebas virales, anlisis de [place].  Segn su radiografa de trax, lo estamos tratando por neumona.  Su anlisis de [employer] en general tranquilizador, aunque su nivel de plaquetas fue bajo, 71. Esto probablemente se deba a la supresin viral por [place]; sin embargo, es importante que consulte a su PCP dentro de la prxima semana para que le repitan los anlisis de [place]. Tome Tylenol e ibuprofeno segn sea necesario para la fiebre y los dolores corporales.  Puede utilizar Zofran segn sea necesario para las nuseas y los vmitos.  Adems, le enviaremos una recarga de su inhalador de albuterol. Comunquese con el oftalmlogo mencionado anteriormente para programar una evaluacin ambulatoria dada su visin borrosa intermitente. Si experimenta  incapacidad para retener alimentos o bebidas, prdida del conocimiento, dolor de cabeza intenso, nuevos cambios en la visin o cualquier otra inquietud, regrese al servicio de urgencias para una reevaluacin.

## 2023-09-28 NOTE — ED Provider Triage Note (Signed)
Emergency Medicine Provider Triage Evaluation Note  [name] , a 58 y.o. female  was evaluated in triage.  Pt complains of fever, headache, abd pain that started on Sunday (100F). No sick contacts  Abd pain has been going on for a week. Nausea, vomiting (1x) and diarrhea two days ago. Endorses diffuse abd pain worse pain at epigastric and right side of abdomen. Last BM yesterday and normal  Diffuse HA 10/10 pain.   Review of Systems  Positive: See hpi Negative: Blurred vision  Physical Exam  BP 106/80 (BP Location: Right Arm)   Pulse (!) 107   Temp 100.2 F (37.9 C)   Resp 16   Ht 4\' 10"  (1.473 m)   Wt 79.4 kg   LMP 07/27/2015 (Approximate)   SpO2 92%   BMI 36.58 kg/m  Gen:   Awake, no distress   Resp:  Normal effort  MSK:   Moves extremities without difficulty  Other:  Diffuse abd TTP  Medical Decision Making  Medically screening exam initiated at 12:28 PM.  Appropriate orders placed.  [name] was informed that the remainder of the evaluation will be completed by another provider, this initial triage assessment does not replace that evaluation, and the importance of remaining in the ED until their evaluation is complete.  Workup started. Tylenol and zofran given for HA, temp 100.2, nausea,   Judithann Sheen, [place] 09/28/23 1234

## 2023-10-15 ENCOUNTER — Encounter: Payer: Self-pay | Admitting: Internal Medicine

## 2023-11-10 ENCOUNTER — Other Ambulatory Visit: Payer: Self-pay

## 2023-11-10 DIAGNOSIS — R748 Abnormal levels of other serum enzymes: Secondary | ICD-10-CM

## 2023-11-10 DIAGNOSIS — R7303 Prediabetes: Secondary | ICD-10-CM

## 2023-11-10 DIAGNOSIS — E7800 Pure hypercholesterolemia, unspecified: Secondary | ICD-10-CM

## 2023-11-10 DIAGNOSIS — D696 Thrombocytopenia, unspecified: Secondary | ICD-10-CM

## 2023-11-10 DIAGNOSIS — F1011 Alcohol abuse, in remission: Secondary | ICD-10-CM

## 2023-11-10 DIAGNOSIS — D691 Qualitative platelet defects: Secondary | ICD-10-CM

## 2023-11-11 LAB — LIPID PANEL W/O CHOL/HDL RATIO
Cholesterol, Total: 175 mg/dL (ref 100–199)
HDL: 47 mg/dL (ref >39)
LDL Chol Calc (NIH): 107 mg/dL — ABNORMAL HIGH (ref 0–99)
Triglycerides: 118 mg/dL (ref 0–149)
VLDL Cholesterol Cal: 21 mg/dL (ref 5–40)

## 2023-11-11 LAB — CBC WITH DIFFERENTIAL/PLATELET
Basophils Absolute: 0.1 10*3/uL (ref 0.0–0.2)
Basos: 1 %
EOS (ABSOLUTE): 0.5 10*3/uL — ABNORMAL HIGH (ref 0.0–0.4)
Eos: 6 %
Hematocrit: 38.2 % (ref 34.0–46.6)
Hemoglobin: 12.4 g/dL (ref 11.1–15.9)
Immature Grans (Abs): 0 10*3/uL (ref 0.0–0.1)
Immature Granulocytes: 0 %
Lymphocytes Absolute: 3.8 10*3/uL — ABNORMAL HIGH (ref 0.7–3.1)
Lymphs: 46 %
MCH: 28.6 pg (ref 26.6–33.0)
MCHC: 32.5 g/dL (ref 31.5–35.7)
MCV: 88 fL (ref 79–97)
Monocytes Absolute: 0.8 10*3/uL (ref 0.1–0.9)
Monocytes: 10 %
Neutrophils Absolute: 3 10*3/uL (ref 1.4–7.0)
Neutrophils: 37 %
Platelets: 64 10*3/uL — CL (ref 150–450)
RBC: 4.33 x10E6/uL (ref 3.77–5.28)
RDW: 13.9 % (ref 11.7–15.4)
WBC: 8.1 10*3/uL (ref 3.4–10.8)

## 2023-11-11 LAB — COMPREHENSIVE METABOLIC PANEL
ALT: 55 IU/L — ABNORMAL HIGH (ref 0–32)
AST: 32 IU/L (ref 0–40)
Albumin: 4.3 g/dL (ref 3.8–4.9)
Alkaline Phosphatase: 196 IU/L — ABNORMAL HIGH (ref 44–121)
BUN/Creatinine Ratio: 31 — ABNORMAL HIGH (ref 9–23)
BUN: 15 mg/dL (ref 6–24)
Bilirubin Total: 0.4 mg/dL (ref 0.0–1.2)
CO2: 21 mmol/L (ref 20–29)
Calcium: 9.1 mg/dL (ref 8.7–10.2)
Chloride: 106 mmol/L (ref 96–106)
Creatinine, Ser: 0.48 mg/dL — ABNORMAL LOW (ref 0.57–1.00)
Globulin, Total: 2.4 g/dL (ref 1.5–4.5)
Glucose: 98 mg/dL (ref 70–99)
Potassium: 3.9 mmol/L (ref 3.5–5.2)
Sodium: 143 mmol/L (ref 134–144)
Total Protein: 6.7 g/dL (ref 6.0–8.5)
eGFR: 112 mL/min/{1.73_m2} (ref >59)

## 2023-11-11 LAB — HEMOGLOBIN A1C
Est. average glucose Bld gHb Est-mCnc: 117 mg/dL
Hgb A1c MFr Bld: 5.7 % — ABNORMAL HIGH (ref 4.8–5.6)

## 2023-11-13 ENCOUNTER — Encounter: Payer: Self-pay | Admitting: Internal Medicine

## 2023-11-13 NOTE — Addendum Note (Signed)
 Addended by: Marcene Duos on: 11/13/2023 09:35 AM   Modules accepted: Orders

## 2023-11-20 ENCOUNTER — Other Ambulatory Visit: Payer: Self-pay

## 2023-11-20 DIAGNOSIS — D696 Thrombocytopenia, unspecified: Secondary | ICD-10-CM

## 2023-11-20 DIAGNOSIS — R748 Abnormal levels of other serum enzymes: Secondary | ICD-10-CM

## 2023-11-21 LAB — HIV ANTIBODY (ROUTINE TESTING W REFLEX): HIV Screen 4th Generation wRfx: NONREACTIVE

## 2023-11-21 LAB — HEPATITIS B SURFACE ANTIGEN: Hepatitis B Surface Ag: NEGATIVE

## 2023-11-21 LAB — HEPATITIS B CORE ANTIBODY, TOTAL: Hep B Core Total Ab: NEGATIVE

## 2023-11-21 LAB — HEPATITIS B SURFACE ANTIBODY,QUALITATIVE: Hep B Surface Ab, Qual: REACTIVE

## 2023-11-21 LAB — HEPATITIS C ANTIBODY: Hep C Virus Ab: NONREACTIVE

## 2023-11-21 LAB — LACTATE DEHYDROGENASE: LDH: 233 IU/L — ABNORMAL HIGH (ref 119–226)

## 2023-11-21 NOTE — Progress Notes (Signed)
 Spoke with pcp about patient's findings

## 2023-11-23 ENCOUNTER — Encounter: Payer: Self-pay | Admitting: Internal Medicine

## 2023-11-23 ENCOUNTER — Ambulatory Visit: Payer: Self-pay | Admitting: Internal Medicine

## 2023-11-23 VITALS — BP 130/80 | HR 64 | Resp 20 | Ht <= 58 in | Wt 172.0 lb

## 2023-11-23 DIAGNOSIS — D696 Thrombocytopenia, unspecified: Secondary | ICD-10-CM

## 2023-11-23 DIAGNOSIS — Z550 Illiteracy and low-level literacy: Secondary | ICD-10-CM

## 2023-11-23 DIAGNOSIS — K625 Hemorrhage of anus and rectum: Secondary | ICD-10-CM | POA: Insufficient documentation

## 2023-11-23 NOTE — Progress Notes (Signed)
    Subjective:    Patient ID: [name], female   DOB: 01/13/1966, 58 y.o.   MRN: [id number]   HPI  Duayne Cal interprets  Here to follow up on thrombocytopenia:   Has been noted to have decreasing platelet count--was 124 in December, then 20 and now 15.   As in telephone call, she states she feels she bruises of bleeds appropriately with injury.  However, cut her left pinky with a knife yesterday preparing food and bled for about 5 minutes, but was also flushing with water.  Once applied direct pressure, stopped bleeding--pad of finger.   No bleeding from gums with brushing.   She did have BRBPR on tissue and in toilet water with BM yesterday.  She states it always happens when she eats chiles.  She generally avoids eating chiles because of this.    History of alcohol use disorder likely until about 2016 to 2017 --improved with counseling with Nilda Simmer, LCSW.  She does drink limited amounts of alcohol rarely now.  Last intake was in December when had 36 oz of beer.    HIV, Hep C screening negative.  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    LDH minimally elevated at 233 (high is 226)   Current Meds  Medication Sig   albuterol (VENTOLIN HFA) 108 (90 Base) MCG/ACT inhaler Inhale 1-2 puffs into the lungs every 6 (six) hours as needed for wheezing or shortness of breath.   Ascorbic Acid (VITAMIN C PO) Take 1 tablet by mouth daily.   ASPIRIN ADULT PO Take by mouth as needed.   ferrous sulfate 325 (65 FE) MG EC tablet Take 325 mg by mouth daily with breakfast.   Multiple Vitamin (MULTIVITAMIN) tablet Take 1 tablet by mouth daily. Reported on 11/05/2015   Omega-3 Fatty Acids (OMEGA-3 FISH OIL PO) Take by mouth daily.   No Known Allergies   Review of Systems    Objective:   BP 130/80 (BP Location: Left Arm, Patient Position: Sitting, Cuff Size: Normal)   Pulse 64   Resp 20   Ht 4' 7.5" (1.41 m)   Wt 172 lb (78 kg)   LMP 07/27/2015  (Approximate)   BMI 39.26 kg/m   Physical Exam NAD Lungs: CTA CV:  RRR without murmur or rub.  Radial and DP pulses normal and equal Abd:  Obese, + BS, NT, No definite HSM.  No mass.  Exam limited by size Skin:  bruise on medial elbow where she was hit by a door recently.  Cut on pad of left pinky that is healing.     Assessment & Plan   Thrombocytopenia:  minimal elevation of LDH, and no mention of hemolytic changes to RBCs with microscopic evaluation by [facility].  Checking protime.  With recent BRBPR, checking CBC again.  Feel most likely cause is cirrhosis and spenomegaly with her history of alcohol use disorder.  Sending for ultrasound of abdomen.  She will apply for financial assistance.  2.    Needs orange card and ID:  will have SW work with her on case management.  Call into [facility] to help with ID possiblly.

## 2023-11-24 LAB — PROTIME-INR
INR: 1 (ref 0.9–1.2)
Prothrombin Time: 11.3 s (ref 9.1–12.0)

## 2023-11-24 LAB — CBC WITH DIFFERENTIAL/PLATELET
Basophils Absolute: 0.1 10*3/uL (ref 0.0–0.2)
Basos: 1 %
EOS (ABSOLUTE): 0.4 10*3/uL (ref 0.0–0.4)
Eos: 6 %
Hematocrit: 40.4 % (ref 34.0–46.6)
Hemoglobin: 12.9 g/dL (ref 11.1–15.9)
Immature Grans (Abs): 0 10*3/uL (ref 0.0–0.1)
Immature Granulocytes: 0 %
Lymphocytes Absolute: 3.4 10*3/uL — ABNORMAL HIGH (ref 0.7–3.1)
Lymphs: 45 %
MCH: 28.4 pg (ref 26.6–33.0)
MCHC: 31.9 g/dL (ref 31.5–35.7)
MCV: 89 fL (ref 79–97)
Monocytes Absolute: 0.8 10*3/uL (ref 0.1–0.9)
Monocytes: 11 %
Neutrophils Absolute: 2.8 10*3/uL (ref 1.4–7.0)
Neutrophils: 37 %
Platelets: 48 10*3/uL — CL (ref 150–450)
RBC: 4.54 x10E6/uL (ref 3.77–5.28)
RDW: 13.9 % (ref 11.7–15.4)
WBC: 7.5 10*3/uL (ref 3.4–10.8)

## 2023-11-30 ENCOUNTER — Ambulatory Visit (HOSPITAL_COMMUNITY)
Admission: RE | Admit: 2023-11-30 | Discharge: 2023-11-30 | Disposition: A | Discharge status: Home / Self Care | Payer: Self-pay | Source: Ambulatory Visit | Attending: Internal Medicine | Admitting: Internal Medicine

## 2023-11-30 DIAGNOSIS — K625 Hemorrhage of anus and rectum: Secondary | ICD-10-CM | POA: Insufficient documentation

## 2023-11-30 DIAGNOSIS — D696 Thrombocytopenia, unspecified: Secondary | ICD-10-CM | POA: Insufficient documentation

## 2023-12-11 NOTE — Addendum Note (Signed)
 Addended by: Trudi Fus on: 12/11/2023 09:02 AM   Modules accepted: Orders
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
## 2024-01-10 ENCOUNTER — Encounter: Payer: Self-pay | Admitting: Physician Assistant

## 2024-01-10 ENCOUNTER — Telehealth: Payer: Self-pay

## 2024-01-10 ENCOUNTER — Other Ambulatory Visit: Payer: Self-pay
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
## 2024-01-19 ENCOUNTER — Inpatient Hospital Stay: Payer: Self-pay

## 2024-01-19 VITALS — BP 119/75 | HR 73 | Temp 97.7°F | Resp 14 | Wt 174.4 lb

## 2024-01-19 DIAGNOSIS — D696 Thrombocytopenia, unspecified: Secondary | ICD-10-CM | POA: Insufficient documentation

## 2024-01-19 LAB — CMP (CANCER CENTER ONLY)
ALT: 70 U/L — ABNORMAL HIGH (ref 0–44)
AST: 51 U/L — ABNORMAL HIGH (ref 15–41)
Albumin: 4.1 g/dL (ref 3.5–5.0)
Alkaline Phosphatase: 157 U/L — ABNORMAL HIGH (ref 38–126)
Anion gap: 6 (ref 5–15)
BUN: 19 mg/dL (ref 6–20)
CO2: 25 mmol/L (ref 22–32)
Calcium: 8.8 mg/dL — ABNORMAL LOW (ref 8.9–10.3)
Chloride: 109 mmol/L (ref 98–111)
Creatinine: 0.79 mg/dL (ref 0.44–1.00)
GFR, Estimated: >60 mL/min (ref >60)
Glucose, Bld: 101 mg/dL — ABNORMAL HIGH (ref 70–99)
Potassium: 3.9 mmol/L (ref 3.5–5.1)
Sodium: 140 mmol/L (ref 135–145)
Total Bilirubin: 0.3 mg/dL (ref 0.0–1.2)
Total Protein: 7.2 g/dL (ref 6.5–8.1)

## 2024-01-19 LAB — CBC WITH DIFFERENTIAL (CANCER CENTER ONLY)
Abs Immature Granulocytes: 0.02 10*3/uL (ref 0.00–0.07)
Basophils Absolute: 0.1 10*3/uL (ref 0.0–0.1)
Basophils Relative: 1 %
Eosinophils Absolute: 0.3 10*3/uL (ref 0.0–0.5)
Eosinophils Relative: 4 %
HCT: 36.6 % (ref 36.0–46.0)
Hemoglobin: 12.2 g/dL (ref 12.0–15.0)
Immature Granulocytes: 0 %
Lymphocytes Relative: 36 %
Lymphs Abs: 2.5 10*3/uL (ref 0.7–4.0)
MCH: 28.3 pg (ref 26.0–34.0)
MCHC: 33.3 g/dL (ref 30.0–36.0)
MCV: 84.9 fL (ref 80.0–100.0)
Monocytes Absolute: 0.7 10*3/uL (ref 0.1–1.0)
Monocytes Relative: 10 %
Neutro Abs: 3.5 10*3/uL (ref 1.7–7.7)
Neutrophils Relative %: 49 %
Platelet Count: 85 10*3/uL — ABNORMAL LOW (ref 150–400)
RBC: 4.31 MIL/uL (ref 3.87–5.11)
RDW: 13.2 % (ref 11.5–15.5)
Smear Review: NORMAL
WBC Count: 7.1 10*3/uL (ref 4.0–10.5)
nRBC: 0 % (ref 0.0–0.2)

## 2024-01-19 LAB — PROTIME-INR
INR: 1.1 (ref 0.8–1.2)
Prothrombin Time: 14.7 s (ref 11.4–15.2)

## 2024-01-19 LAB — FERRITIN: Ferritin: 22 ng/mL (ref 11–307)

## 2024-01-19 LAB — WBC/PLT IN CITRATE

## 2024-01-19 LAB — APTT: aPTT: 29 s (ref 24–36)

## 2024-01-19 LAB — FOLATE: Folate: 27 ng/mL (ref >5.9)

## 2024-01-19 LAB — VITAMIN B12: Vitamin B-12: 1011 pg/mL — ABNORMAL HIGH (ref 180–914)

## 2024-01-21 LAB — COPPER, SERUM: Copper: 115 ug/dL (ref 80–158)

## 2024-01-23 ENCOUNTER — Ambulatory Visit: Payer: Self-pay

## 2024-01-24 LAB — METHYLMALONIC ACID, SERUM: Methylmalonic Acid, Quantitative: 187 nmol/L (ref 0–378)

## 2024-01-31 NOTE — Assessment & Plan Note (Addendum)
 Will replace iron and reevaluate with short-term follow-up Oral iron once daily

## 2024-01-31 NOTE — Assessment & Plan Note (Addendum)
 Improved Asymptomatic Will replace iron first and repeat testing with short-term follow-up

## 2024-01-31 NOTE — Progress Notes (Signed)
 [facility] OFFICE PROGRESS NOTE  Patient Care Team: Ronalee Cocking, MD as PCP - General (Internal Medicine)  58 y.o.female with history of fatty liver, obesity, HLD referred to Hematology for thrombocytopenia.  Assessment & Plan Thrombocytopenia (HCC) Improved Asymptomatic Adequate levels of b12, folate, copper . Normal PT/APTT. Elevated LFT/steatosis Will replace iron first and repeat testing with short-term follow-up Other iron deficiency anemia Will replace iron and reevaluate with short-term follow-up Oral iron once daily  Orders Placed This Encounter  Procedures   CBC with Differential (Cancer Center Only)    Standing Status:   Future    Expiration Date:   01/31/2025   Comprehensive metabolic panel with GFR    Standing Status:   Future    Expiration Date:   01/31/2025   Lactate dehydrogenase    Standing Status:   Future    Expiration Date:   01/31/2025   Ferritin    Standing Status:   Future    Expiration Date:   01/31/2025   Protime-INR    Standing Status:   Future    Expiration Date:   01/31/2025     Lowanda Ruddy, MD  INTERVAL HISTORY: Patient returns for follow-up.  No spontaneous bleeding or bruising, blood nose, hematuria, vaginal bleeding, melena.  No loss of appetite, night sweats, unexpected weight loss.   PHYSICAL EXAMINATION: ECOG PERFORMANCE STATUS: 0 - Asymptomatic  Vitals:   02/01/24 1536  BP: 118/75  Pulse: 84  Resp: 18  Temp: (!) 97.5 F (36.4 C)  SpO2: 96%   Filed Weights   02/01/24 1536  Weight: 176 lb 4.8 oz (80 kg)   No acute distress.  No signs of apparent bruising on exposed skin.  Relevant data reviewed during this visit included labs.

## 2024-02-01 ENCOUNTER — Inpatient Hospital Stay: Payer: Self-pay

## 2024-02-01 VITALS — BP 118/75 | HR 84 | Temp 97.5°F | Resp 18 | Wt 176.3 lb

## 2024-02-01 DIAGNOSIS — D508 Other iron deficiency anemias: Secondary | ICD-10-CM | POA: Insufficient documentation

## 2024-02-01 DIAGNOSIS — R7989 Other specified abnormal findings of blood chemistry: Secondary | ICD-10-CM | POA: Insufficient documentation

## 2024-02-01 DIAGNOSIS — D696 Thrombocytopenia, unspecified: Secondary | ICD-10-CM | POA: Insufficient documentation

## 2024-02-13 ENCOUNTER — Telehealth: Payer: Self-pay | Admitting: *Deleted

## 2024-02-13 NOTE — Telephone Encounter (Signed)
-----   Message from Lowanda Ruddy sent at 02/08/2024  5:48 PM EDT ----- Dorathy Gals would you let her know to take oral ferrous sulfate  325 mg daily (not with calcium product or mild) but with a vit c tab. Thank you.

## 2024-02-13 NOTE — Telephone Encounter (Signed)
 LM via interpreter with message below.

## 2024-03-25 ENCOUNTER — Ambulatory Visit: Payer: Self-pay | Admitting: Internal Medicine

## 2024-03-28 ENCOUNTER — Inpatient Hospital Stay: Payer: Self-pay

## 2024-03-28 VITALS — BP 132/78 | HR 82 | Temp 97.5°F | Resp 20 | Wt 182.3 lb

## 2024-03-28 DIAGNOSIS — D696 Thrombocytopenia, unspecified: Secondary | ICD-10-CM

## 2024-03-28 DIAGNOSIS — D508 Other iron deficiency anemias: Secondary | ICD-10-CM

## 2024-03-28 DIAGNOSIS — R7989 Other specified abnormal findings of blood chemistry: Secondary | ICD-10-CM | POA: Insufficient documentation

## 2024-03-28 LAB — COMPREHENSIVE METABOLIC PANEL WITH GFR
ALT: 90 U/L — ABNORMAL HIGH (ref 0–44)
AST: 59 U/L — ABNORMAL HIGH (ref 15–41)
Albumin: 4.1 g/dL (ref 3.5–5.0)
Alkaline Phosphatase: 187 U/L — ABNORMAL HIGH (ref 38–126)
Anion gap: 5 (ref 5–15)
BUN: 11 mg/dL (ref 6–20)
CO2: 28 mmol/L (ref 22–32)
Calcium: 9.6 mg/dL (ref 8.9–10.3)
Chloride: 106 mmol/L (ref 98–111)
Creatinine, Ser: 0.39 mg/dL — ABNORMAL LOW (ref 0.44–1.00)
GFR, Estimated: >60 mL/min (ref >60)
Glucose, Bld: 93 mg/dL (ref 70–99)
Potassium: 3.7 mmol/L (ref 3.5–5.1)
Sodium: 139 mmol/L (ref 135–145)
Total Bilirubin: 0.3 mg/dL (ref 0.0–1.2)
Total Protein: 7.5 g/dL (ref 6.5–8.1)

## 2024-03-28 LAB — CBC WITH DIFFERENTIAL (CANCER CENTER ONLY)
Abs Immature Granulocytes: 0.03 K/uL (ref 0.00–0.07)
Basophils Absolute: 0.1 K/uL (ref 0.0–0.1)
Basophils Relative: 1 %
Eosinophils Absolute: 0.4 K/uL (ref 0.0–0.5)
Eosinophils Relative: 5 %
HCT: 38.4 % (ref 36.0–46.0)
Hemoglobin: 12.7 g/dL (ref 12.0–15.0)
Immature Granulocytes: 0 %
Lymphocytes Relative: 40 %
Lymphs Abs: 3.5 K/uL (ref 0.7–4.0)
MCH: 28 pg (ref 26.0–34.0)
MCHC: 33.1 g/dL (ref 30.0–36.0)
MCV: 84.6 fL (ref 80.0–100.0)
Monocytes Absolute: 0.9 K/uL (ref 0.1–1.0)
Monocytes Relative: 10 %
Neutro Abs: 3.8 K/uL (ref 1.7–7.7)
Neutrophils Relative %: 44 %
Platelet Count: 56 K/uL — ABNORMAL LOW (ref 150–400)
RBC: 4.54 MIL/uL (ref 3.87–5.11)
RDW: 14.3 % (ref 11.5–15.5)
WBC Count: 8.7 K/uL (ref 4.0–10.5)
nRBC: 0 % (ref 0.0–0.2)

## 2024-03-28 LAB — PROTIME-INR
INR: 1 (ref 0.8–1.2)
Prothrombin Time: 13.7 s (ref 11.4–15.2)

## 2024-03-28 LAB — LACTATE DEHYDROGENASE: LDH: 222 U/L — ABNORMAL HIGH (ref 98–192)

## 2024-03-28 NOTE — Progress Notes (Signed)
  [facility] OFFICE PROGRESS NOTE  Patient Care Team: Adella Norris, MD as PCP - General (Internal Medicine)  58 y.o.female with history of fatty liver, obesity, HLD referred to Hematology for thrombocytopenia and iron deficiency anemia.   Adequate levels of b12, folate, copper . Normal PT/APTT. Elevated LFT/steatosis.  US  from 11/2023 showed fatty infiltration. No alcohol use.   Worsening thrombocytopenia.  Recommend bone marrow biopsy  Assessment & Plan Thrombocytopenia (HCC) Adequate levels of b12, folate, copper . Normal PT/APTT. Elevated LFT/steatosis Negative for HIV, Hepatitis C  Will obtain bone marrow biopsy Follow-up 1 to 2 weeks after bone marrow biopsy  Orders Placed This Encounter  Procedures   CT BONE MARROW BIOPSY & ASPIRATION    Standing Status:   Future    Expected Date:   04/09/2024    Expiration Date:   03/28/2025    Reason for Exam (SYMPTOM  OR DIAGNOSIS REQUIRED):   persistent thrombocytopenia r/o lymphoma, malignancy.    Is patient pregnant?:   No    Preferred imaging location?:   [facility]    Radiology Contrast Protocol - do NOT remove file path:   [email or web address]     Pauletta JAYSON Chihuahua, MD  INTERVAL HISTORY: Patient returns for follow-up. She has been taking oral iron once daily. No bleeding, bloody stool, melena, hematuria, vaginal bleeding. No bruises.  No drenching night sweats, loss of appetite, weight loss, lump or mass.   Past Medical History:  Diagnosis Date   Anemia 08/29/2012   Due to heavy periods   Blood transfusion without reported diagnosis 08/29/2012   Anemia due to heavy periods   Hypercholesterolemia 07/2023   Microcytic anemia 08/11/2012   Obesity, morbid, BMI 40.0-49.9 (HCC)    Prediabetes 07/2023   Pyelonephritis 08/11/2012   Thrombocytopenia (HCC) 07/2023    PHYSICAL EXAMINATION: ECOG PERFORMANCE STATUS: 1 - Symptomatic but completely ambulatory  Vitals:   03/28/24  1545  BP: 132/78  Pulse: 82  Resp: 20  Temp: (!) 97.5 F (36.4 C)  SpO2: 97%   Filed Weights   03/28/24 1545  Weight: 182 lb 4.8 oz (82.7 kg)   GENERAL: alert, no distress and comfortable. obese SKIN: skin color normal and no jaundice on exposed skin EYES: sclera clear NECK: No palpable mass LYMPH:  no palpable cervical, axillary lymphadenopathy  LUNGS: clear to auscultation and percussion with normal breathing effort HEART: regular rate & rhythm  ABDOMEN: abdomen soft, non-tender and nondistended. Musculoskeletal: no edema  Relevant data reviewed during this visit included labs. New biopsy ordered.

## 2024-03-28 NOTE — Assessment & Plan Note (Addendum)
 Adequate levels of b12, folate, copper . Normal PT/APTT. Elevated LFT/steatosis Negative for HIV, Hepatitis C  Will obtain bone marrow biopsy Follow-up 1 to 2 weeks after bone marrow biopsy

## 2024-03-29 LAB — FERRITIN: Ferritin: 56 ng/mL (ref 11–307)

## 2024-04-08 ENCOUNTER — Other Ambulatory Visit: Payer: Self-pay | Admitting: Radiology

## 2024-04-08 DIAGNOSIS — D696 Thrombocytopenia, unspecified: Secondary | ICD-10-CM

## 2024-04-09 ENCOUNTER — Other Ambulatory Visit: Payer: Self-pay

## 2024-04-09 ENCOUNTER — Ambulatory Visit (HOSPITAL_COMMUNITY)
Admission: RE | Admit: 2024-04-09 | Discharge: 2024-04-09 | Disposition: A | Discharge status: Home / Self Care | Payer: Self-pay | Source: Ambulatory Visit

## 2024-04-09 ENCOUNTER — Encounter (HOSPITAL_COMMUNITY): Payer: Self-pay

## 2024-04-09 DIAGNOSIS — M79604 Pain in right leg: Secondary | ICD-10-CM | POA: Insufficient documentation

## 2024-04-09 DIAGNOSIS — E785 Hyperlipidemia, unspecified: Secondary | ICD-10-CM | POA: Insufficient documentation

## 2024-04-09 DIAGNOSIS — M79605 Pain in left leg: Secondary | ICD-10-CM | POA: Insufficient documentation

## 2024-04-09 DIAGNOSIS — D649 Anemia, unspecified: Secondary | ICD-10-CM | POA: Insufficient documentation

## 2024-04-09 DIAGNOSIS — K760 Fatty (change of) liver, not elsewhere classified: Secondary | ICD-10-CM | POA: Insufficient documentation

## 2024-04-09 DIAGNOSIS — D696 Thrombocytopenia, unspecified: Secondary | ICD-10-CM | POA: Insufficient documentation

## 2024-04-09 DIAGNOSIS — E669 Obesity, unspecified: Secondary | ICD-10-CM | POA: Insufficient documentation

## 2024-04-09 DIAGNOSIS — G8929 Other chronic pain: Secondary | ICD-10-CM | POA: Insufficient documentation

## 2024-04-09 LAB — CBC WITH DIFFERENTIAL/PLATELET
Abs Immature Granulocytes: 0.05 K/uL (ref 0.00–0.07)
Basophils Absolute: 0.1 K/uL (ref 0.0–0.1)
Basophils Relative: 1 %
Eosinophils Absolute: 0.5 K/uL (ref 0.0–0.5)
Eosinophils Relative: 5 %
HCT: 38.6 % (ref 36.0–46.0)
Hemoglobin: 12.1 g/dL (ref 12.0–15.0)
Immature Granulocytes: 1 %
Lymphocytes Relative: 45 %
Lymphs Abs: 4.2 K/uL — ABNORMAL HIGH (ref 0.7–4.0)
MCH: 27.2 pg (ref 26.0–34.0)
MCHC: 31.3 g/dL (ref 30.0–36.0)
MCV: 86.7 fL (ref 80.0–100.0)
Monocytes Absolute: 0.9 K/uL (ref 0.1–1.0)
Monocytes Relative: 9 %
Neutro Abs: 3.7 K/uL (ref 1.7–7.7)
Neutrophils Relative %: 39 %
Platelets: 53 K/uL — ABNORMAL LOW (ref 150–400)
RBC: 4.45 MIL/uL (ref 3.87–5.11)
RDW: 14.5 % (ref 11.5–15.5)
Smear Review: NORMAL
WBC: 9.3 K/uL (ref 4.0–10.5)
nRBC: 0 % (ref 0.0–0.2)

## 2024-04-09 MED ORDER — MIDAZOLAM HCL 2 MG/2ML IJ SOLN
INTRAMUSCULAR | Status: AC
  Filled 2024-04-09: qty 4

## 2024-04-09 MED ORDER — SODIUM CHLORIDE 0.9 % IV SOLN: INTRAVENOUS | Status: DC

## 2024-04-09 MED ORDER — FENTANYL CITRATE (PF) 100 MCG/2ML IJ SOLN
INTRAMUSCULAR | Status: AC | PRN
  Administered 2024-04-09 (×4): 50 ug via INTRAVENOUS

## 2024-04-09 MED ORDER — FENTANYL CITRATE (PF) 100 MCG/2ML IJ SOLN
INTRAMUSCULAR | Status: AC
  Filled 2024-04-09: qty 2

## 2024-04-09 MED ORDER — MIDAZOLAM HCL 2 MG/2ML IJ SOLN
INTRAMUSCULAR | Status: AC | PRN
  Administered 2024-04-09 (×4): 1 mg via INTRAVENOUS
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
## 2024-04-09 NOTE — Procedures (Signed)
Interventional Radiology Procedure Note  Procedure: CT guided aspirate and core biopsy of right iliac bone  Complications: None  Recommendations: - Bedrest supine x 1 hrs - Hydrocodone PRN  Pain - Follow biopsy results   Arel Tippen, MD   

## 2024-04-09 NOTE — Discharge Instructions (Signed)
 Please call Interventional Radiology clinic [phone] with any questions or concerns.  You may remove your dressing and shower tomorrow.  Por favor llame al nmero anterior con cualquier pregunta o preocupaciones.   Puedes quitarte el vendaje y Freight forwarder.

## 2024-04-11 ENCOUNTER — Ambulatory Visit: Payer: Self-pay

## 2024-04-11 DIAGNOSIS — D696 Thrombocytopenia, unspecified: Secondary | ICD-10-CM

## 2024-04-11 LAB — SURGICAL PATHOLOGY

## 2024-04-16 ENCOUNTER — Encounter (HOSPITAL_COMMUNITY): Payer: Self-pay

## 2024-04-16 ENCOUNTER — Ambulatory Visit: Payer: Self-pay

## 2024-04-17 ENCOUNTER — Ambulatory Visit: Payer: Self-pay | Admitting: Internal Medicine

## 2024-04-17 VITALS — BP 127/76 | HR 79 | Resp 18 | Ht <= 58 in | Wt 184.0 lb

## 2024-04-17 DIAGNOSIS — Z1231 Encounter for screening mammogram for malignant neoplasm of breast: Secondary | ICD-10-CM

## 2024-04-17 DIAGNOSIS — Z6841 Body Mass Index (BMI) 40.0 and over, adult: Secondary | ICD-10-CM

## 2024-04-17 DIAGNOSIS — K625 Hemorrhage of anus and rectum: Secondary | ICD-10-CM

## 2024-04-17 DIAGNOSIS — D696 Thrombocytopenia, unspecified: Secondary | ICD-10-CM

## 2024-04-17 DIAGNOSIS — E66813 Obesity, class 3: Secondary | ICD-10-CM

## 2024-04-17 DIAGNOSIS — Z23 Encounter for immunization: Secondary | ICD-10-CM

## 2024-04-17 DIAGNOSIS — R7303 Prediabetes: Secondary | ICD-10-CM

## 2024-04-17 NOTE — Progress Notes (Signed)
 Subjective:    Patient ID: [name], female   DOB: 10-31-1965, 58 y.o.   MRN: [id number]   HPI   Thrombocytopenia: extensive evaluation by Dr. Marjorie, Heme/Onc, including a recent bone marrow aspiration with minimal findings of megarkaryocytic and erythropoetic lines and cytogenetic report that was normal.  Last platelet count at 53,000.  2.  Pain in area of bone marrow aspiration--right mid sacral area.  Does not bother her unless she is active in home.  Tylenol  helps--taking 1000 mg each time.  She states it relieves the pain for 24 hours with one dose.  No fever.  3.  Rectal bleeding:  has not had any further episodes.  Avoiding chiles, which she feels is the only trigger.  4.  Anxious:  thinks because she is not working and bored.  She would like to walk, but was afraid to take the tylenol  every day.  She enjoys walking with a friend, but the friend cannot always join her.    Current Meds  Medication Sig   albuterol  (VENTOLIN  HFA) 108 (90 Base) MCG/ACT inhaler Inhale 1-2 puffs into the lungs every 6 (six) hours as needed for wheezing or shortness of breath.   Ascorbic Acid (VITAMIN C PO) Take 1 tablet by mouth daily.   ASPIRIN ADULT PO Take by mouth as needed.   calcium gluconate 500 MG tablet Take 1 tablet by mouth 3 (three) times daily.   citalopram  (CELEXA ) 20 MG tablet 1 tab by mouth daily   ferrous sulfate  325 (65 FE) MG EC tablet Take 325 mg by mouth daily with breakfast.   Multiple Vitamin (MULTIVITAMIN) tablet Take 1 tablet by mouth daily. Reported on 11/05/2015   Naproxen-Liniment (FLANAX PAIN RELIEF CO) 1 tablet by Combination route 2 (two) times daily as needed (pain).   Omega-3 Fatty Acids (OMEGA-3 FISH OIL PO) Take by mouth daily.   No Known Allergies   Review of Systems    Objective:   BP 127/76 (BP Location: Left Arm, Patient Position: Sitting, Cuff Size: Normal)   Pulse 79   Resp 18   Ht 4' 8 (1.422 m)   Wt 184 lb (83.5 kg)   LMP 07/27/2015  (Approximate)   BMI 41.25 kg/m   Physical Exam Constitutional:      Appearance: She is obese.  HENT:     Head: Normocephalic and atraumatic.     Mouth/Throat:     Mouth: Mucous membranes are moist.     Pharynx: Oropharynx is clear.  Eyes:     Extraocular Movements: Extraocular movements intact.     Pupils: Pupils are equal, round, and reactive to light.  Cardiovascular:     Rate and Rhythm: Normal rate and regular rhythm.     Pulses: Normal pulses.     Heart sounds: S1 normal and S2 normal. No murmur heard.    No friction rub. No S3 or S4 sounds.  Abdominal:     General: Bowel sounds are normal.     Palpations: Abdomen is soft. There is no mass.     Tenderness: There is no abdominal tenderness.     Hernia: No hernia is present.  Musculoskeletal:     Cervical back: Normal range of motion and neck supple.     Right lower leg: No edema.     Left lower leg: No edema.     Comments: Right sacral area:  small puncture area without surrounding erythema or swelling.  NT superficially.  Skin:  General: Skin is warm.     Findings: No rash.  Neurological:     Mental Status: She is alert.      Assessment & Plan   Thrombocytopenia:  as per Heme Onc.  No malignancy at this point.  Await final assessment/recommendations.  Site of bone marrow aspiration appears fine.  2.  Rectal bleeding:  resolved.  Avoid chiles.  3.  Anxiety and obesity:  encouraged her to use Tylenol  and get out and physically active.    4.  Prediabetes:  as in #3.  A1C  5.  HM:  Pneumococcal 20.  Mammogram.  Encouraged influenza and COVID vaccines in Sept/Oct.

## 2024-04-18 ENCOUNTER — Other Ambulatory Visit: Payer: Self-pay | Admitting: Internal Medicine

## 2024-04-19 LAB — HGB A1C W/O EAG: Hgb A1c MFr Bld: 6 % — ABNORMAL HIGH (ref 4.8–5.6)

## 2024-04-25 NOTE — Progress Notes (Unsigned)
 [facility] OFFICE PROGRESS NOTE  Patient Care Team: Adella Norris, MD as PCP - General (Internal Medicine)  58 y.o.female with history of fatty liver, obesity, HLD referred to Hematology for thrombocytopenia and iron deficiency anemia.    Adequate levels of b12, folate, copper . Normal PT/APTT. Elevated LFT/steatosis.  US  from 11/2023 showed fatty infiltration. No alcohol use.  BM biopsy did not show malignancy, or MDS. Recommend GI evaluation of liver disease and biopsy. Assessment & Plan   No orders of the defined types were placed in this encounter.    Pauletta JAYSON Chihuahua, MD  INTERVAL HISTORY: Patient returns for follow-up.  Oncology History   No history exists.     PHYSICAL EXAMINATION: ECOG PERFORMANCE STATUS: {CHL ONC ECOG [id number]}  There were no vitals filed for this visit. There were no vitals filed for this visit.  GENERAL: alert, no distress and comfortable SKIN: skin color normal and no bruising or petechiae or jaundice on exposed skin EYES: normal, sclera clear OROPHARYNX: no exudate  NECK: No palpable mass LYMPH:  no palpable cervical, axillary lymphadenopathy  LUNGS: clear to auscultation and percussion with normal breathing effort HEART: regular rate & rhythm  ABDOMEN: abdomen soft, non-tender and nondistended. Musculoskeletal: no edema NEURO: no focal motor/sensory deficits  Relevant data reviewed during this visit included ***

## 2024-04-26 ENCOUNTER — Inpatient Hospital Stay: Payer: Self-pay

## 2024-04-26 VITALS — BP 121/84 | HR 91 | Temp 98.2°F | Resp 8 | Ht <= 58 in

## 2024-04-26 DIAGNOSIS — D696 Thrombocytopenia, unspecified: Secondary | ICD-10-CM | POA: Insufficient documentation

## 2024-04-26 LAB — CBC WITH DIFFERENTIAL (CANCER CENTER ONLY)
Abs Immature Granulocytes: 0.06 K/uL (ref 0.00–0.07)
Basophils Absolute: 0.1 K/uL (ref 0.0–0.1)
Basophils Relative: 1 %
Eosinophils Absolute: 0.3 K/uL (ref 0.0–0.5)
Eosinophils Relative: 3 %
HCT: 41.2 % (ref 36.0–46.0)
Hemoglobin: 13.8 g/dL (ref 12.0–15.0)
Immature Granulocytes: 1 %
Lymphocytes Relative: 37 %
Lymphs Abs: 3.6 K/uL (ref 0.7–4.0)
MCH: 28.5 pg (ref 26.0–34.0)
MCHC: 33.5 g/dL (ref 30.0–36.0)
MCV: 84.9 fL (ref 80.0–100.0)
Monocytes Absolute: 0.7 K/uL (ref 0.1–1.0)
Monocytes Relative: 7 %
Neutro Abs: 4.8 K/uL (ref 1.7–7.7)
Neutrophils Relative %: 51 %
Platelet Count: 43 K/uL — ABNORMAL LOW (ref 150–400)
RBC: 4.85 MIL/uL (ref 3.87–5.11)
RDW: 14.5 % (ref 11.5–15.5)
WBC Count: 9.5 K/uL (ref 4.0–10.5)
nRBC: 0 % (ref 0.0–0.2)

## 2024-04-26 LAB — HEPATIC FUNCTION PANEL
ALT: 154 U/L — ABNORMAL HIGH (ref 0–44)
AST: 79 U/L — ABNORMAL HIGH (ref 15–41)
Albumin: 4.5 g/dL (ref 3.5–5.0)
Alkaline Phosphatase: 193 U/L — ABNORMAL HIGH (ref 38–126)
Bilirubin, Direct: 0.1 mg/dL (ref 0.0–0.2)
Indirect Bilirubin: 0.3 mg/dL (ref 0.3–0.9)
Total Bilirubin: 0.4 mg/dL (ref 0.0–1.2)
Total Protein: 7.3 g/dL (ref 6.5–8.1)

## 2024-04-26 LAB — GAMMA GT: GGT: 49 U/L (ref 7–50)

## 2024-04-26 NOTE — Assessment & Plan Note (Signed)
 CBC, CMP, LDH in 6 months Follow-up earlier if new concerns

## 2024-04-30 ENCOUNTER — Ambulatory Visit: Payer: Self-pay

## 2024-04-30 DIAGNOSIS — R748 Abnormal levels of other serum enzymes: Secondary | ICD-10-CM

## 2024-04-30 NOTE — Telephone Encounter (Signed)
-----   Message from Pauletta JAYSON Chihuahua sent at 04/30/2024 12:00 PM EDT ----- Zorita Would you let her know that her liver enzymes are worsening so recommend referral from PCP to GI for evaluation.  I also ordered CT abdomen and pelvis.  Let's set up a follow-up about 1-2 weeks after  CT is scheduled.  She speaks [place].  Thank you. ----- Message ----- From: Rebecka, Lab In [place] Sent: 04/26/2024  12:51 PM EDT To: Pauletta JAYSON Chihuahua, MD

## 2024-04-30 NOTE — Telephone Encounter (Signed)
 Called patient via spanish interpreter with message below.

## 2024-05-03 ENCOUNTER — Ambulatory Visit (HOSPITAL_COMMUNITY): Payer: Self-pay

## 2024-05-03 ENCOUNTER — Other Ambulatory Visit: Payer: Self-pay | Admitting: Student

## 2024-05-10 ENCOUNTER — Ambulatory Visit (HOSPITAL_COMMUNITY)
Admission: RE | Admit: 2024-05-10 | Discharge: 2024-05-10 | Disposition: A | Discharge status: Home / Self Care | Payer: Self-pay | Source: Ambulatory Visit

## 2024-05-10 DIAGNOSIS — R748 Abnormal levels of other serum enzymes: Secondary | ICD-10-CM | POA: Insufficient documentation

## 2024-05-10 MED ORDER — IOHEXOL 300 MG/ML  SOLN
100.0000 mL | Freq: Once | INTRAMUSCULAR | Status: AC | PRN
  Administered 2024-05-10: 100 mL via INTRAVENOUS

## 2024-05-10 MED ORDER — IOHEXOL 350 MG/ML SOLN
100.0000 mL | Freq: Once | INTRAVENOUS | Status: DC | PRN
Start: 2024-05-10 — End: 2024-05-10

## 2024-05-13 ENCOUNTER — Ambulatory Visit: Payer: Self-pay

## 2024-05-13 NOTE — Telephone Encounter (Signed)
 Notified of message below via interpreter

## 2024-05-13 NOTE — Telephone Encounter (Signed)
-----   Message from Pauletta JAYSON Chihuahua sent at 05/13/2024  1:04 PM EDT ----- Zorita would you let her know the CT did not show liver mass or lesion, but has fatty infiltration or fatty liver. Make sure she has follow up with PCP and GI.  No other concerning new findings  reported.  Thank you.  She needs a [place] interpreter.  Thanks. ----- Message ----- From: Interface, Rad Results In Sent: 05/10/2024   7:23 PM EDT To: Pauletta JAYSON Chihuahua, MD

## 2024-06-17 ENCOUNTER — Ambulatory Visit: Payer: Self-pay | Admitting: Internal Medicine

## 2024-06-17 ENCOUNTER — Encounter: Payer: Self-pay | Admitting: Internal Medicine

## 2024-06-17 DIAGNOSIS — Z6841 Body Mass Index (BMI) 40.0 and over, adult: Secondary | ICD-10-CM

## 2024-06-17 DIAGNOSIS — D696 Thrombocytopenia, unspecified: Secondary | ICD-10-CM

## 2024-06-17 DIAGNOSIS — R7303 Prediabetes: Secondary | ICD-10-CM

## 2024-10-21 ENCOUNTER — Inpatient Hospital Stay: Payer: Self-pay

## 2024-10-25 ENCOUNTER — Other Ambulatory Visit: Payer: Self-pay

## 2024-10-25 ENCOUNTER — Ambulatory Visit: Payer: Self-pay

## 2024-11-01 ENCOUNTER — Other Ambulatory Visit: Payer: Self-pay

## 2024-11-05 ENCOUNTER — Encounter: Payer: Self-pay | Admitting: Internal Medicine
